# Patient Record
Sex: Male | Born: 1960 | Marital: Married | Smoking: Never smoker
Health system: Southern US, Community
[De-identification: ages and names within clinical notes are randomized; demographics above are authoritative.]

## PROBLEM LIST (undated history)

## (undated) DIAGNOSIS — I10 Essential (primary) hypertension: Secondary | ICD-10-CM

## (undated) HISTORY — PX: COLONOSCOPY W/ BIOPSIES: SHX1374

---

## 2011-12-02 ENCOUNTER — Ambulatory Visit (INDEPENDENT_AMBULATORY_CARE_PROVIDER_SITE_OTHER): Payer: BC Managed Care – PPO

## 2011-12-02 DIAGNOSIS — Z130 Encounter for screening for diseases of the blood and blood-forming organs and certain disorders involving the immune mechanism: Secondary | ICD-10-CM

## 2011-12-02 DIAGNOSIS — Z23 Encounter for immunization: Secondary | ICD-10-CM

## 2021-09-15 ENCOUNTER — Emergency Department (HOSPITAL_BASED_OUTPATIENT_CLINIC_OR_DEPARTMENT_OTHER)
Admission: EM | Admit: 2021-09-15 | Discharge: 2021-09-15 | Disposition: A | Discharge status: Home / Self Care | Payer: Managed Care, Other (non HMO) | Attending: Emergency Medicine | Admitting: Emergency Medicine

## 2021-09-15 ENCOUNTER — Other Ambulatory Visit: Payer: Self-pay

## 2021-09-15 ENCOUNTER — Encounter (HOSPITAL_BASED_OUTPATIENT_CLINIC_OR_DEPARTMENT_OTHER): Payer: Self-pay | Admitting: *Deleted

## 2021-09-15 DIAGNOSIS — I10 Essential (primary) hypertension: Secondary | ICD-10-CM | POA: Diagnosis not present

## 2021-09-15 DIAGNOSIS — D61818 Other pancytopenia: Secondary | ICD-10-CM | POA: Diagnosis present

## 2021-09-15 DIAGNOSIS — Z8616 Personal history of COVID-19: Secondary | ICD-10-CM | POA: Insufficient documentation

## 2021-09-15 HISTORY — DX: Essential (primary) hypertension: I10

## 2021-09-15 LAB — COMPREHENSIVE METABOLIC PANEL
ALT: 13 U/L (ref 0–44)
AST: 12 U/L — ABNORMAL LOW (ref 15–41)
Albumin: 4.3 g/dL (ref 3.5–5.0)
Alkaline Phosphatase: 56 U/L (ref 38–126)
Anion gap: 10 (ref 5–15)
BUN: 13 mg/dL (ref 8–23)
CO2: 25 mmol/L (ref 22–32)
Calcium: 9.4 mg/dL (ref 8.9–10.3)
Chloride: 105 mmol/L (ref 98–111)
Creatinine, Ser: 0.7 mg/dL (ref 0.61–1.24)
GFR, Estimated: >60 mL/min (ref >60)
Glucose, Bld: 104 mg/dL — ABNORMAL HIGH (ref 70–99)
Potassium: 3.7 mmol/L (ref 3.5–5.1)
Sodium: 140 mmol/L (ref 135–145)
Total Bilirubin: 0.5 mg/dL (ref 0.3–1.2)
Total Protein: 6.9 g/dL (ref 6.5–8.1)

## 2021-09-15 LAB — PROTIME-INR
INR: 1 (ref 0.8–1.2)
Prothrombin Time: 13.6 seconds (ref 11.4–15.2)

## 2021-09-15 LAB — CBC
HCT: 25.3 % — ABNORMAL LOW (ref 39.0–52.0)
Hemoglobin: 8.5 g/dL — ABNORMAL LOW (ref 13.0–17.0)
MCH: 36.2 pg — ABNORMAL HIGH (ref 26.0–34.0)
MCHC: 33.6 g/dL (ref 30.0–36.0)
MCV: 107.7 fL — ABNORMAL HIGH (ref 80.0–100.0)
Platelets: 35 10*3/uL — ABNORMAL LOW (ref 150–400)
RBC: 2.35 MIL/uL — ABNORMAL LOW (ref 4.22–5.81)
RDW: 15.1 % (ref 11.5–15.5)
WBC: 2.2 10*3/uL — ABNORMAL LOW (ref 4.0–10.5)
nRBC: 0 % (ref 0.0–0.2)

## 2021-09-15 LAB — RETICULOCYTES
Immature Retic Fract: 20.3 % — ABNORMAL HIGH (ref 2.3–15.9)
RBC.: 2.33 MIL/uL — ABNORMAL LOW (ref 4.22–5.81)
Retic Count, Absolute: 33.8 10*3/uL (ref 19.0–186.0)
Retic Ct Pct: 1.5 % (ref 0.4–3.1)

## 2021-09-15 NOTE — ED Provider Notes (Signed)
[facility] EMERGENCY DEPT Provider Note   CSN: [id number] Arrival date & time: 09/15/21  1743     History Chief Complaint  Patient presents with   Abnormal Labs    Bryce Reyes is a 60 y.o. male.  HPI Patient presents for an incidental finding of pancytopenia.  He has no known medical conditions other than hypertension.  For his hypertension, he takes losartan.  Early in the summer, he went to donate blood.  He was told at that time that he could not because his hemoglobin was 8.9.  He was also told that he had low blood pressure at that time.  He stopped taking his losartan for a while but then resumed taking it.  He had no known history of anemia prior to that.  He states that he gets yearly physicals.  His last physical would have been around April.  He was not informed of any abnormal results at that time.  He had a mild COVID infection over the summer.  He states that he has been in his normal state of health.  On Monday, he rode 14 miles on his bicycle.  When asked how he has felt, he states "I have never felt better".  Earlier today, he went and got lab work done.  Lab work showed anemia, thrombocytopenia, and leukopenia.  He was contacted via telephone and told to come to the ED immediately.  Patient denies any recent bleeding, dark stools, fatigue, night sweats.  He has noticed a recent postprandial burning in his epigastrium but denies any other recent symptoms at all.    Past Medical History:  Diagnosis Date   Hypertension     There are no problems to display for this patient.   History reviewed. No pertinent surgical history.     No family history on file.  Social History   Tobacco Use   Smoking status: Never   Smokeless tobacco: Never  Vaping Use   Vaping Use: Never used  Substance Use Topics   Alcohol use: Yes   Drug use: Never    Home Medications Prior to Admission medications   Not on File    Allergies    Patient has no known  allergies.  Review of Systems   Review of Systems  Constitutional:  Negative for activity change, chills, diaphoresis, fatigue and fever.  HENT:  Negative for congestion, ear pain and sore throat.   Eyes:  Negative for pain and visual disturbance.  Respiratory:  Negative for cough, chest tightness, shortness of breath and wheezing.   Cardiovascular:  Negative for chest pain and palpitations.  Gastrointestinal:  Negative for abdominal pain, diarrhea, nausea and vomiting.  Genitourinary:  Negative for dysuria, flank pain and hematuria.  Musculoskeletal:  Negative for arthralgias, back pain, gait problem, joint swelling, myalgias and neck pain.  Skin:  Negative for color change, pallor and rash.  Neurological:  Negative for dizziness, seizures, syncope, weakness, light-headedness, numbness and headaches.  All other systems reviewed and are negative.  Physical Exam Updated Vital Signs BP 125/78 (BP Location: Right Arm)   Pulse 74   Temp 98.2 F (36.8 C) (Oral)   Resp 20   Ht 5' 8"  (1.727 m)   Wt 74.4 kg   SpO2 100%   BMI 24.94 kg/m   Physical Exam Vitals and nursing note reviewed.  Constitutional:      General: He is not in acute distress.    Appearance: Normal appearance. He is well-developed. He is not ill-appearing or  toxic-appearing.  HENT:     Head: Normocephalic and atraumatic.     Right Ear: External ear normal.     Left Ear: External ear normal.     Nose: Nose normal.     Mouth/Throat:     Mouth: Mucous membranes are moist.     Pharynx: Oropharynx is clear.  Eyes:     General: No scleral icterus.    Extraocular Movements: Extraocular movements intact.     Conjunctiva/sclera: Conjunctivae normal.  Cardiovascular:     Rate and Rhythm: Normal rate and regular rhythm.     Heart sounds: No murmur heard. Pulmonary:     Effort: Pulmonary effort is normal. No respiratory distress.     Breath sounds: Normal breath sounds. No wheezing or rales.  Chest:     Chest wall:  No tenderness.  Abdominal:     Palpations: Abdomen is soft.     Tenderness: There is no abdominal tenderness. There is no right CVA tenderness, left CVA tenderness or guarding.  Musculoskeletal:        General: No swelling or tenderness. Normal range of motion.     Cervical back: Normal range of motion and neck supple. No rigidity or tenderness.     Right lower leg: No edema.     Left lower leg: No edema.  Lymphadenopathy:     Cervical: No cervical adenopathy.  Skin:    General: Skin is warm and dry.     Capillary Refill: Capillary refill takes less than 2 seconds.     Coloration: Skin is not jaundiced or pale.  Neurological:     General: No focal deficit present.     Mental Status: He is alert and oriented to person, place, and time.     Cranial Nerves: No cranial nerve deficit.     Sensory: No sensory deficit.     Motor: No weakness.     Coordination: Coordination normal.  Psychiatric:        Mood and Affect: Mood normal.        Behavior: Behavior normal.    ED Results / Procedures / Treatments   Labs (all labs ordered are listed, but only abnormal results are displayed) Labs Reviewed  CBC - Abnormal; Notable for the following components:      Result Value   WBC 2.2 (*)    RBC 2.35 (*)    Hemoglobin 8.5 (*)    HCT 25.3 (*)    MCV 107.7 (*)    MCH 36.2 (*)    Platelets 35 (*)    All other components within normal limits  COMPREHENSIVE METABOLIC PANEL - Abnormal; Notable for the following components:   Glucose, Bld 104 (*)    AST 12 (*)    All other components within normal limits  LACTATE DEHYDROGENASE - Abnormal; Notable for the following components:   LDH 207 (*)    All other components within normal limits  RETICULOCYTES - Abnormal; Notable for the following components:   RBC. 2.33 (*)    Immature Retic Fract 20.3 (*)    All other components within normal limits  CBC WITH DIFFERENTIAL/PLATELET - Abnormal; Notable for the following components:   WBC 2.1 (*)     RBC 2.36 (*)    Hemoglobin 8.5 (*)    HCT 25.3 (*)    MCV 107.2 (*)    MCH 36.0 (*)    Platelets 43 (*)    Neutro Abs 0.6 (*)    All other components within  normal limits  URIC ACID  PROTIME-INR  VITAMIN B12    EKG None  Radiology No results found.  Procedures Procedures   Medications Ordered in ED Medications - No data to display  ED Course  I have reviewed the triage vital signs and the nursing notes.  Pertinent labs & imaging results that were available during my care of the patient were reviewed by me and considered in my medical decision making (see chart for details).    MDM Rules/Calculators/A&P                          Patient presents for incidental finding of pancytopenia on lab work earlier today.  He denies any recent symptoms.  Vital signs upon arrival in the ED are normal.  On exam, patient is well-appearing.  There is no evidence of color change, adenopathy, pallor, areas of swelling, or tenderness.  Prior to being bedded in the ED, basic labs were obtained.  CBC showed leukopenia, macrocytic anemia, and thrombocytopenia.  I spoke with the hematologist on-call.  He advised, given absence of recent symptoms of bleeding, patient would be appropriate for discharge home with close follow-up next week.  He will likely require bone marrow biopsy for diagnosis of underlying etiology.  Additional lab work was obtained in the ED.  I spoke with the patient, who was clear that he wanted to go home, about return precautions and importance of follow-up.  Patient was informed that his white blood cell count is low and this puts him at risk for infection.  He was also informed that his platelet count was low and this puts him at risk for bleeding.  He was advised to return to the ED if he does experience any new symptoms.  If he continues to feel well, patient should follow-up with oncologist soon as possible next week.  Contact information was provided.  Patient was discharged in  stable condition.  Final Clinical Impression(s) / ED Diagnoses Final diagnoses:  Pancytopenia [facility])    Rx / [place] Orders ED Discharge Orders     None        Godfrey Pick, MD 09/17/21 1427

## 2021-09-15 NOTE — Discharge Instructions (Addendum)
Call the number on this paperwork to schedule a follow-up of appointment with Dr. Benay Spice.  Please return to the emergency department immediately if you develop any bleeding.

## 2021-09-15 NOTE — ED Triage Notes (Signed)
Pt states he had labs obtained at [facility] about 3:15, Hgb 8.4 WBC 1.0. They called him around 5pm and was insistent he come to the ED.

## 2021-09-16 LAB — CBC WITH DIFFERENTIAL/PLATELET
Abs Immature Granulocytes: 0.01 10*3/uL (ref 0.00–0.07)
Basophils Absolute: 0 10*3/uL (ref 0.0–0.1)
Basophils Relative: 0 %
Eosinophils Absolute: 0.1 10*3/uL (ref 0.0–0.5)
Eosinophils Relative: 3 %
HCT: 25.3 % — ABNORMAL LOW (ref 39.0–52.0)
Hemoglobin: 8.5 g/dL — ABNORMAL LOW (ref 13.0–17.0)
Immature Granulocytes: 1 %
Lymphocytes Relative: 62 %
Lymphs Abs: 1.3 10*3/uL (ref 0.7–4.0)
MCH: 36 pg — ABNORMAL HIGH (ref 26.0–34.0)
MCHC: 33.6 g/dL (ref 30.0–36.0)
MCV: 107.2 fL — ABNORMAL HIGH (ref 80.0–100.0)
Monocytes Absolute: 0.1 10*3/uL (ref 0.1–1.0)
Monocytes Relative: 5 %
Neutro Abs: 0.6 10*3/uL — ABNORMAL LOW (ref 1.7–7.7)
Neutrophils Relative %: 29 %
Platelets: 43 10*3/uL — ABNORMAL LOW (ref 150–400)
RBC: 2.36 MIL/uL — ABNORMAL LOW (ref 4.22–5.81)
RDW: 15.3 % (ref 11.5–15.5)
WBC: 2.1 10*3/uL — ABNORMAL LOW (ref 4.0–10.5)
nRBC: 0 % (ref 0.0–0.2)

## 2021-09-16 LAB — LACTATE DEHYDROGENASE: LDH: 207 U/L — ABNORMAL HIGH (ref 98–192)

## 2021-09-16 LAB — URIC ACID: Uric Acid, Serum: 4.1 mg/dL (ref 3.7–8.6)

## 2021-09-16 LAB — VITAMIN B12: Vitamin B-12: 365 pg/mL (ref 180–914)

## 2021-09-18 ENCOUNTER — Telehealth: Payer: Self-pay | Admitting: Hematology and Oncology

## 2021-09-18 NOTE — Telephone Encounter (Signed)
Scheduled appt per 10/10 referral. Pt is aware of appt date and time.

## 2021-09-20 ENCOUNTER — Inpatient Hospital Stay: Payer: Managed Care, Other (non HMO)

## 2021-09-20 ENCOUNTER — Inpatient Hospital Stay: Payer: Managed Care, Other (non HMO) | Attending: Hematology and Oncology | Admitting: Hematology and Oncology

## 2021-09-20 ENCOUNTER — Other Ambulatory Visit: Payer: Self-pay

## 2021-09-20 VITALS — BP 137/86 | HR 95 | Temp 98.3°F | Resp 18 | Wt 166.3 lb

## 2021-09-20 DIAGNOSIS — D61818 Other pancytopenia: Secondary | ICD-10-CM

## 2021-09-20 DIAGNOSIS — Z807 Family history of other malignant neoplasms of lymphoid, hematopoietic and related tissues: Secondary | ICD-10-CM | POA: Diagnosis not present

## 2021-09-20 DIAGNOSIS — Z85048 Personal history of other malignant neoplasm of rectum, rectosigmoid junction, and anus: Secondary | ICD-10-CM | POA: Diagnosis not present

## 2021-09-20 DIAGNOSIS — I10 Essential (primary) hypertension: Secondary | ICD-10-CM | POA: Diagnosis not present

## 2021-09-20 DIAGNOSIS — E538 Deficiency of other specified B group vitamins: Secondary | ICD-10-CM

## 2021-09-20 DIAGNOSIS — Z8616 Personal history of COVID-19: Secondary | ICD-10-CM | POA: Diagnosis not present

## 2021-09-20 LAB — CMP (CANCER CENTER ONLY)
ALT: 16 U/L (ref 0–44)
AST: 14 U/L — ABNORMAL LOW (ref 15–41)
Albumin: 4.2 g/dL (ref 3.5–5.0)
Alkaline Phosphatase: 61 U/L (ref 38–126)
Anion gap: 7 (ref 5–15)
BUN: 15 mg/dL (ref 8–23)
CO2: 28 mmol/L (ref 22–32)
Calcium: 9.8 mg/dL (ref 8.9–10.3)
Chloride: 111 mmol/L (ref 98–111)
Creatinine: 0.7 mg/dL (ref 0.61–1.24)
GFR, Estimated: >60 mL/min (ref >60)
Glucose, Bld: 105 mg/dL — ABNORMAL HIGH (ref 70–99)
Potassium: 4 mmol/L (ref 3.5–5.1)
Sodium: 146 mmol/L — ABNORMAL HIGH (ref 135–145)
Total Bilirubin: 0.4 mg/dL (ref 0.3–1.2)
Total Protein: 7.2 g/dL (ref 6.5–8.1)

## 2021-09-20 LAB — CBC WITH DIFFERENTIAL (CANCER CENTER ONLY)
Abs Immature Granulocytes: 0.01 10*3/uL (ref 0.00–0.07)
Basophils Absolute: 0 10*3/uL (ref 0.0–0.1)
Basophils Relative: 0 %
Eosinophils Absolute: 0 10*3/uL (ref 0.0–0.5)
Eosinophils Relative: 2 %
HCT: 24.8 % — ABNORMAL LOW (ref 39.0–52.0)
Hemoglobin: 8.4 g/dL — ABNORMAL LOW (ref 13.0–17.0)
Immature Granulocytes: 1 %
Lymphocytes Relative: 54 %
Lymphs Abs: 1.1 10*3/uL (ref 0.7–4.0)
MCH: 36.5 pg — ABNORMAL HIGH (ref 26.0–34.0)
MCHC: 33.9 g/dL (ref 30.0–36.0)
MCV: 107.8 fL — ABNORMAL HIGH (ref 80.0–100.0)
Monocytes Absolute: 0.1 10*3/uL (ref 0.1–1.0)
Monocytes Relative: 5 %
Neutro Abs: 0.8 10*3/uL — ABNORMAL LOW (ref 1.7–7.7)
Neutrophils Relative %: 38 %
Platelet Count: 34 10*3/uL — ABNORMAL LOW (ref 150–400)
RBC: 2.3 MIL/uL — ABNORMAL LOW (ref 4.22–5.81)
RDW: 15.1 % (ref 11.5–15.5)
WBC Count: 2 10*3/uL — ABNORMAL LOW (ref 4.0–10.5)
nRBC: 0 % (ref 0.0–0.2)

## 2021-09-20 LAB — FOLATE: Folate: 19.9 ng/mL (ref >5.9)

## 2021-09-20 LAB — VITAMIN B12: Vitamin B-12: 192 pg/mL (ref 180–914)

## 2021-09-20 LAB — RETIC PANEL
Immature Retic Fract: 19.7 % — ABNORMAL HIGH (ref 2.3–15.9)
RBC.: 2.23 MIL/uL — ABNORMAL LOW (ref 4.22–5.81)
Retic Count, Absolute: 25.2 10*3/uL (ref 19.0–186.0)
Retic Ct Pct: 1.1 % (ref 0.4–3.1)
Reticulocyte Hemoglobin: 37.3 pg (ref >27.9)

## 2021-09-20 LAB — LACTATE DEHYDROGENASE: LDH: 178 U/L (ref 98–192)

## 2021-09-20 NOTE — Progress Notes (Signed)
Bryce Reyes [phone]) [phone]   Fax:([phone]) [name] NOTE  Patient Care Team: Kathyrn Lass, MD as PCP - General (Family Medicine)  Hematological/Oncological History # Pancytopenia 09/15/2021: WBC 1.8, Hgb 8.4, MCV 105.5, Plt 33 (at [facility]) 09/15/2021: Sent to Emergency Department same day. WBC 2.1, Hgb 8.5, MCV 107.2, Plt 43 09/20/2021: establish care with Dr. Lorenso Courier   CHIEF COMPLAINTS/PURPOSE OF CONSULTATION:  "Pancytopenia "  HISTORY OF PRESENTING ILLNESS:  Bryce Reyes 60 y.o. male with medical history significant for hypertension who presents for evaluation of pancytopenia.   On review of the previous records Bryce Reyes was seen at [facility] on 09/15/2021 for an abscess on his chest. He was prescribed antibiotics bactrim after doxycycline failed to improve his symptoms. His CBC showed WBC 1.8, Hgb 8.4, MCV 105.5, Plt 33. Due to this he was sent to the Emergency Department where his counts were repeated and confirmed. He was noted to have Vitamin b12 365, LDH 207, and unremarkable CMP. Due to concern for these findings he was referred to hematology for further evaluation and management.   On exam today Bryce Reyes reports that his problems began in August when he went to donate blood and he was "rejected because his hemoglobin was too low".  He believes his hemoglobin that time was 8.9.  He notes that he typically donates blood a few times per year and this is surprising to him.  He notes that he eats mostly vegetarian but is not strict and occasionally does eat meat.  He notes that he is currently taking a multivitamin with iron and is currently scheduled for an appointment with GI on Wednesday.  He notes that he otherwise feels great.  He notes that he rides his bike about once per week up to 14 miles.  He has noticed recently getting more tired walking up stairs and he always feels tired when he goes to bed at night.  He notes that he is having  some bruising from his blood draws on his arms.  He denies having any issues with nosebleeds, gum bleeding, or dark stools.  On further discussion he notes that he is a never smoker and drinks about 1 to 2 glasses of beer or wine per week.  He notes his family history is remarkable for rectal cancer in his mother and non-Hodgkin's lymphoma and congestive heart failure in his father.  He notes that he did have COVID early this summer.  He also recently developed a cyst/abscess on his chest for which he was treated with antibiotics.  He otherwise denies any fevers, chills, sweats, nausea, vomiting or diarrhea.  A full 10 point ROS is listed below.  MEDICAL HISTORY:  Past Medical History:  Diagnosis Date   Hypertension     SURGICAL HISTORY: No past surgical history on file.  SOCIAL HISTORY: Social History   Socioeconomic History   Marital status: Married    Spouse name: Not on file   Number of children: Not on file   Years of education: Not on file   Highest education level: Not on file  Occupational History   Not on file  Tobacco Use   Smoking status: Never   Smokeless tobacco: Never  Vaping Use   Vaping Use: Never used  Substance and Sexual Activity   Alcohol use: Yes   Drug use: Never   Sexual activity: Not on file  Other Topics Concern   Not on file  Social History Narrative   Not on  file   Social Determinants of Health   Financial Resource Strain: Not on file  Food Insecurity: Not on file  Transportation Needs: Not on file  Physical Activity: Not on file  Stress: Not on file  Social Connections: Not on file  Intimate Partner Violence: Not on file    FAMILY HISTORY: No family history on file.  ALLERGIES:  has No Known Allergies.  MEDICATIONS:  Current Outpatient Medications  Medication Sig Dispense Refill   losartan (COZAAR) 50 MG tablet Take 50 mg by mouth daily.     RED YEAST RICE EXTRACT PO Take 300 mg by mouth 2 (two) times daily.     TURMERIC CURCUMIN  PO Take 1 capsule by mouth daily.     No current facility-administered medications for this visit.    REVIEW OF SYSTEMS:   Constitutional: ( - ) fevers, ( - )  chills , ( - ) night sweats Eyes: ( - ) blurriness of vision, ( - ) double vision, ( - ) watery eyes Ears, nose, mouth, throat, and face: ( - ) mucositis, ( - ) sore throat Respiratory: ( - ) cough, ( - ) dyspnea, ( - ) wheezes Cardiovascular: ( - ) palpitation, ( - ) chest discomfort, ( - ) lower extremity swelling Gastrointestinal:  ( - ) nausea, ( - ) heartburn, ( - ) change in bowel habits Skin: ( - ) abnormal skin rashes Lymphatics: ( - ) new lymphadenopathy, ( - ) easy bruising Neurological: ( - ) numbness, ( - ) tingling, ( - ) new weaknesses Behavioral/Psych: ( - ) mood change, ( - ) new changes  All other systems were reviewed with the patient and are negative.  PHYSICAL EXAMINATION: ECOG PERFORMANCE STATUS: 0 - Asymptomatic  Vitals:   09/20/21 1315  BP: 137/86  Pulse: 95  Resp: 18  Temp: 98.3 F (36.8 C)  SpO2: 100%   Filed Weights   09/20/21 1315  Weight: 166 lb 4.8 oz (75.4 kg)    GENERAL: well appearing middle-aged Caucasian male in NAD  SKIN: skin color, texture, turgor are normal, no rashes or significant lesions EYES: conjunctiva are pink and non-injected, sclera clear LUNGS: clear to auscultation and percussion with normal breathing effort HEART: regular rate & rhythm and no murmurs and no lower extremity edema Musculoskeletal: no cyanosis of digits and no clubbing  PSYCH: alert & oriented x 3, fluent speech NEURO: no focal motor/sensory deficits  LABORATORY DATA:  I have reviewed the data as listed CBC Latest Ref Rng & Units 09/20/2021 09/15/2021 09/15/2021  WBC 4.0 - 10.5 K/uL 2.0(L) 2.1(L) 2.2(L)  Hemoglobin 13.0 - 17.0 g/dL 8.4(L) 8.5(L) 8.5(L)  Hematocrit 39.0 - 52.0 % 24.8(L) 25.3(L) 25.3(L)  Platelets 150 - 400 K/uL 34(L) 43(L) 35(L)    CMP Latest Ref Rng & Units 09/20/2021 09/15/2021   Glucose 70 - 99 mg/dL 105(H) 104(H)  BUN 8 - 23 mg/dL 15 13  Creatinine 0.61 - 1.24 mg/dL 0.70 0.70  Sodium 135 - 145 mmol/L 146(H) 140  Potassium 3.5 - 5.1 mmol/L 4.0 3.7  Chloride 98 - 111 mmol/L 111 105  CO2 22 - 32 mmol/L 28 25  Calcium 8.9 - 10.3 mg/dL 9.8 9.4  Total Protein 6.5 - 8.1 g/dL 7.2 6.9  Total Bilirubin 0.3 - 1.2 mg/dL 0.4 0.5  Alkaline Phos 38 - 126 U/L 61 56  AST 15 - 41 U/L 14(L) 12(L)  ALT 0 - 44 U/L 16 13    RADIOGRAPHIC STUDIES: No results  found.  ASSESSMENT & PLAN Bryce Reyes 60 y.o. male with medical history significant for hypertension who presents for evaluation of pancytopenia.   After review of the labs, review of the records, and discussion with the patient the patients findings are most consistent with pancytopenia of unclear etiology.  Possible etiologies include nutritional deficiency, infection, or bone marrow dysfunction.  Given how he is virtually asymptomatic I do believe that this developed over a longer period of time and likely represents bone marrow dysfunction.  This is supported by the fact that there is macrocytosis and pancytopenia.  It is possible the patient does have nutritional deficiency but he is not very strict vegetarian and that would be a relatively surprising finding in session otherwise healthy man who is asymptomatic.  At this time I strongly recommend we pursue a bone marrow biopsy while working up other possible etiologies with his blood work.  # Pancytopenia --deeply concerning for a true pancytopenia with neutropenia and macrocytic anemia --will order deeper nutritional evaluation to include MMA, homocysteine --will order SPEP, SFLC --will review peripheral blood smear --patient will require a bone marrow biopsy --Return to clinic with pending the results of the above studies   Orders Placed This Encounter  Procedures   CT BIOPSY    Standing Status:   Future    Standing Expiration Date:   09/21/2022    Order  Specific Question:   Lab orders requested (DO NOT place separate lab orders, these will be automatically ordered during procedure specimen collection):    Answer:   Surgical Pathology    Order Specific Question:   Reason for Exam (SYMPTOM  OR DIAGNOSIS REQUIRED)    Answer:   requesting Bone marrow biopsy due to pancytopenia    Order Specific Question:   Preferred location?    Answer:   [facility]   CT BONE MARROW BIOPSY & ASPIRATION    Standing Status:   Future    Standing Expiration Date:   09/21/2022    Order Specific Question:   Reason for Exam (SYMPTOM  OR DIAGNOSIS REQUIRED)    Answer:   requesting Bone marrow biopsy due to pancytopenia    Order Specific Question:   Preferred location?    Answer:   [facility]   CBC with Differential ([place] Only)    Standing Status:   Future    Number of Occurrences:   1    Standing Expiration Date:   09/20/2022   CMP ([place] only)    Standing Status:   Future    Number of Occurrences:   1    Standing Expiration Date:   09/20/2022   Retic Panel    Standing Status:   Future    Number of Occurrences:   1    Standing Expiration Date:   09/20/2022   Methylmalonic acid, serum    Standing Status:   Future    Number of Occurrences:   1    Standing Expiration Date:   09/20/2022   Lactate dehydrogenase (LDH)    Standing Status:   Future    Number of Occurrences:   1    Standing Expiration Date:   09/20/2022   Vitamin B12    Standing Status:   Future    Number of Occurrences:   1    Standing Expiration Date:   09/20/2022   Folate, Serum    Standing Status:   Future    Number of Occurrences:   1  Standing Expiration Date:   09/20/2022   Homocysteine, serum    Standing Status:   Future    Number of Occurrences:   1    Standing Expiration Date:   09/20/2022   Multiple Myeloma Panel (SPEP&IFE w/QIG)    Standing Status:   Future    Number of Occurrences:   1    Standing Expiration Date:   09/20/2022    Kappa/lambda light chains    Standing Status:   Future    Number of Occurrences:   1    Standing Expiration Date:   09/20/2022   Copper, serum    Standing Status:   Future    Number of Occurrences:   1    Standing Expiration Date:   09/20/2022    All questions were answered. The patient knows to call the clinic with any problems, questions or concerns.  A total of more than 60 minutes were spent on this encounter with face-to-face time and non-face-to-face time, including preparing to see the patient, ordering tests and/or medications, counseling the patient and coordination of care as outlined above.   Ledell Peoples, MD Department of Hematology/Oncology [place] [facility] Phone: [phone] Pager: [phone] Email: [email or web address]  09/24/2021 7:00 PM

## 2021-09-20 NOTE — Patient Instructions (Signed)
Thank you for choosing [facility] to provide your care.   Should you have questions after your visit to the [facility] ([facility]), please contact this office at [phone] between 8:30 AM and 4:30 PM.  Voice mails left after 4:00 PM may not be returned until the following business day.  Calls received after 4:30 PM will be answered by an off-site Nurse Triage Line.    Prescription Refills:  Please have your pharmacy contact us directly for most prescription requests.  Contact the office directly for refills of narcotics (pain medications). Allow 48-72 hours for refills.  Appointments: Please contact the [facility] scheduling department [phone] for questions regarding [facility] appointment scheduling.  Contact the schedulers with any scheduling changes so that your appointment can be rescheduled in a timely manner.   Central Scheduling for [place] [phone] - Call to schedule procedures such as PET scans, CT scans, MRI, Ultrasound, etc.  To afford each patient quality time with our providers, please arrive 30 minutes before your scheduled appointment time.  If you arrive late for your appointment, you may be asked to reschedule.  We strive to give you quality time with our providers, and arriving late affects you and other patients whose appointments are after yours. If you are a no show for multiple scheduled visits, you may be dismissed from the clinic at the providers discretion.     Resources: [facility] Social Workers 336-832-0950 for additional information on assistance programs or assistance connecting with community support programs   [place] DSS  336-641-3447: Information regarding food stamps, Medicaid, and utility assistance [place] Access [place] 336-333-6589   [place] [employer]'s shared-ride transportation service for eligible riders who have a disability that prevents them from riding the fixed route bus.   Medicare Rights Center 800-333-4114  Helps people with Medicare understand their rights and benefits, navigate the Medicare system, and secure the quality healthcare they deserve [employer] 800-227-2345 Assists patients locate various types of support and financial assistance Cancer Care: 1-800-813-HOPE (4673) Provides financial assistance, online support groups, medication/co-pay assistance.   Transportation Assistance for appointments at [facility]: Transportation Coordinator [phone]  Again, thank you for choosing  Cancer Center for your care.       

## 2021-09-21 ENCOUNTER — Telehealth: Payer: Self-pay | Admitting: *Deleted

## 2021-09-21 LAB — KAPPA/LAMBDA LIGHT CHAINS
Kappa free light chain: 20 mg/L — ABNORMAL HIGH (ref 3.3–19.4)
Kappa, lambda light chain ratio: 1.56 (ref 0.26–1.65)
Lambda free light chains: 12.8 mg/L (ref 5.7–26.3)

## 2021-09-21 LAB — HOMOCYSTEINE: Homocysteine: 8.6 umol/L (ref 0.0–17.2)

## 2021-09-21 NOTE — Telephone Encounter (Signed)
Received vm message from pt asking if there is anything he can take to boost his CBC numbers while he is waiting for his CT guided bone marrow biopsy. Returned his call and spoke with him. Advised that there is nothing to take as far as treatment for low counts. The key is getting his bone marrow done, hopefully sooner than 10/23/21. Advised that I have contacted Radiology scheduling to have them move up his biopsy.  Awaiting a call back on that. Made pt ware of this.  Advised continued neutropenia precautions and eating well balanced, iron rich foods.  Advised I will call if we can get his biopsy moved up.  He voiced understanding.

## 2021-09-22 LAB — COPPER, SERUM: Copper: 163 ug/dL — ABNORMAL HIGH (ref 69–132)

## 2021-09-22 LAB — METHYLMALONIC ACID, SERUM: Methylmalonic Acid, Quantitative: 113 nmol/L (ref 0–378)

## 2021-09-25 LAB — MULTIPLE MYELOMA PANEL, SERUM
Albumin SerPl Elph-Mcnc: 3.8 g/dL (ref 2.9–4.4)
Albumin/Glob SerPl: 1.4 (ref 0.7–1.7)
Alpha 1: 0.3 g/dL (ref 0.0–0.4)
Alpha2 Glob SerPl Elph-Mcnc: 0.6 g/dL (ref 0.4–1.0)
B-Globulin SerPl Elph-Mcnc: 1 g/dL (ref 0.7–1.3)
Gamma Glob SerPl Elph-Mcnc: 1 g/dL (ref 0.4–1.8)
Globulin, Total: 2.9 g/dL (ref 2.2–3.9)
IgA: 124 mg/dL (ref 61–437)
IgG (Immunoglobin G), Serum: 958 mg/dL (ref 603–1613)
IgM (Immunoglobulin M), Srm: 98 mg/dL (ref 20–172)
Total Protein ELP: 6.7 g/dL (ref 6.0–8.5)

## 2021-10-04 ENCOUNTER — Telehealth: Payer: Self-pay | Admitting: *Deleted

## 2021-10-04 NOTE — Telephone Encounter (Signed)
TCT patient after receiving a call from him about getting a Covid Vaccine. Advised that Dr. Lorenso Courier recommends he hold off on this for now-until we get a better handle on why his WBC is low.  Also advised that we are working on getting his Bone Marrow Biopsy moved up as it is currently scheduled for 10/23/21  Informed him that I am checking with [facility] as well.  He voiced understanding and is willing to go there if needed.  Advised that I will keep him up to date as I lrarn of of any new availability for this.

## 2021-10-06 ENCOUNTER — Telehealth: Payer: Self-pay | Admitting: *Deleted

## 2021-10-06 NOTE — Telephone Encounter (Signed)
TCT patient on his mobile # . Spoke with family member as he was driving. Put on speaker phone. Advised pt that we were able to re-schedule his Bone marrow biopsy @ [facility] on 10/12/21@ 7:30 am. Pt is agreeable to this  Provided address of that facility and where he needs to go. Someone from their radiology department will call him a couple of days before his procedure  He voiced understanding.

## 2021-10-09 NOTE — Progress Notes (Signed)
Patient on schedule for BMB, called and spoke with patient on phone with pre procedure instructions given. Made aware to be here @ 0730, NPO after MN prior to procedure, and driver post procedure/recovery/discharge. Stated understanding.

## 2021-10-11 ENCOUNTER — Other Ambulatory Visit: Payer: Self-pay | Admitting: Radiology

## 2021-10-12 ENCOUNTER — Other Ambulatory Visit: Payer: Self-pay

## 2021-10-12 ENCOUNTER — Ambulatory Visit
Admission: RE | Admit: 2021-10-12 | Discharge: 2021-10-12 | Disposition: A | Discharge status: Home / Self Care | Payer: Managed Care, Other (non HMO) | Source: Ambulatory Visit | Attending: Hematology and Oncology | Admitting: Hematology and Oncology

## 2021-10-12 DIAGNOSIS — D61818 Other pancytopenia: Secondary | ICD-10-CM | POA: Insufficient documentation

## 2021-10-12 DIAGNOSIS — C9200 Acute myeloblastic leukemia, not having achieved remission: Secondary | ICD-10-CM | POA: Insufficient documentation

## 2021-10-12 LAB — CBC WITH DIFFERENTIAL/PLATELET
Abs Immature Granulocytes: 0.01 10*3/uL (ref 0.00–0.07)
Basophils Absolute: 0 10*3/uL (ref 0.0–0.1)
Basophils Relative: 1 %
Eosinophils Absolute: 0.1 10*3/uL (ref 0.0–0.5)
Eosinophils Relative: 3 %
HCT: 25.5 % — ABNORMAL LOW (ref 39.0–52.0)
Hemoglobin: 8.5 g/dL — ABNORMAL LOW (ref 13.0–17.0)
Immature Granulocytes: 1 %
Lymphocytes Relative: 48 %
Lymphs Abs: 1.1 10*3/uL (ref 0.7–4.0)
MCH: 36.3 pg — ABNORMAL HIGH (ref 26.0–34.0)
MCHC: 33.3 g/dL (ref 30.0–36.0)
MCV: 109 fL — ABNORMAL HIGH (ref 80.0–100.0)
Monocytes Absolute: 0.1 10*3/uL (ref 0.1–1.0)
Monocytes Relative: 5 %
Neutro Abs: 0.9 10*3/uL — ABNORMAL LOW (ref 1.7–7.7)
Neutrophils Relative %: 42 %
Platelets: 33 10*3/uL — ABNORMAL LOW (ref 150–400)
RBC: 2.34 MIL/uL — ABNORMAL LOW (ref 4.22–5.81)
RDW: 14.8 % (ref 11.5–15.5)
Smear Review: NORMAL
WBC: 2.1 10*3/uL — ABNORMAL LOW (ref 4.0–10.5)
nRBC: 0 % (ref 0.0–0.2)

## 2021-10-12 MED ORDER — SODIUM CHLORIDE 0.9 % IV SOLN: INTRAVENOUS | Status: DC

## 2021-10-12 MED ORDER — FENTANYL CITRATE (PF) 100 MCG/2ML IJ SOLN
INTRAMUSCULAR | Status: AC
  Filled 2021-10-12: qty 2

## 2021-10-12 MED ORDER — MIDAZOLAM HCL 2 MG/2ML IJ SOLN
INTRAMUSCULAR | Status: AC
  Filled 2021-10-12: qty 2

## 2021-10-12 MED ORDER — HEPARIN SOD (PORK) LOCK FLUSH 100 UNIT/ML IV SOLN
INTRAVENOUS | Status: AC
  Filled 2021-10-12: qty 5

## 2021-10-12 MED ORDER — MIDAZOLAM HCL 2 MG/2ML IJ SOLN
INTRAMUSCULAR | Status: AC | PRN
  Administered 2021-10-12: 1 mg via INTRAVENOUS

## 2021-10-12 MED ORDER — FENTANYL CITRATE (PF) 100 MCG/2ML IJ SOLN
INTRAMUSCULAR | Status: AC | PRN
  Administered 2021-10-12: 50 ug via INTRAVENOUS

## 2021-10-12 NOTE — H&P (Signed)
Chief Complaint: Patient was seen in consultation today for bone marrow biopsy at the request of Dorsey,John T IV  Referring Physician(s): Dorsey,John T IV  Patient Status: [facility] - Out-pt  History of Present Illness: Bryce Reyes is a 60 y.o. male presenting with pancytopenia.  Hematological/Oncological History # Pancytopenia 09/15/2021: WBC 1.8, Hgb 8.4, MCV 105.5, Plt 33 (at [facility]) 09/15/2021: Sent to Emergency Department same day. WBC 2.1, Hgb 8.5, MCV 107.2, Plt 43 09/20/2021: establish care with Dr. Lorenso Courier   He has had some dyspnea with exertion and fatigue.  Past Medical History:  Diagnosis Date   Hypertension     Past Surgical History:  Procedure Laterality Date   COLONOSCOPY W/ BIOPSIES      Allergies: Patient has no known allergies.  Medications: Prior to Admission medications   Medication Sig Start Date End Date Taking? Authorizing Provider  losartan (COZAAR) 50 MG tablet Take 50 mg by mouth daily. 08/26/21  Yes [provider]  Multiple Vitamins-Iron (MULTIVITAMINS WITH IRON) TABS tablet Take 1 tablet by mouth daily.   Yes [provider]  RED YEAST RICE EXTRACT PO Take 300 mg by mouth 2 (two) times daily.   Yes [provider]  TURMERIC CURCUMIN PO Take 1 capsule by mouth daily.   Yes [provider]     History reviewed. No pertinent family history.  Social History   Socioeconomic History   Marital status: Married    Spouse name: Jana Half   Number of children: 3   Years of education: Not on file   Highest education level: Not on file  Occupational History   Not on file  Tobacco Use   Smoking status: Never   Smokeless tobacco: Never  Vaping Use   Vaping Use: Never used  Substance and Sexual Activity   Alcohol use: Yes    Alcohol/week: 4.0 standard drinks    Types: 2 Glasses of wine, 2 Cans of beer per week    Comment: 12 drinks/week   Drug use: Never   Sexual activity: Not on file  Other Topics  Concern   Not on file  Social History Narrative   Lives at home with wife: 3 kids grown    Social Determinants of Health   Financial Resource Strain: Not on file  Food Insecurity: Not on file  Transportation Needs: Not on file  Physical Activity: Not on file  Stress: Not on file  Social Connections: Not on file    ECOG Status: 1 - Symptomatic but completely ambulatory  Review of Systems: A 12 point ROS discussed and pertinent positives are indicated in the HPI above.  All other systems are negative.  Review of Systems  Constitutional:  Positive for fatigue. Negative for chills and fever.  Respiratory:         Some dyspnea with exertion.  Cardiovascular: Negative.   Gastrointestinal: Negative.   Genitourinary: Negative.   Musculoskeletal: Negative.   Neurological: Negative.   Hematological:        Small area of petechiae recently on distal right forearm.   Vital Signs: BP 127/87   Pulse 81   Temp 98 F (36.7 C) (Oral)   Resp 20   Ht _0  (1.727 m)   Wt 74.8 kg   SpO2 100%   BMI 25.09 kg/m   Physical Exam Vitals reviewed.  Constitutional:      General: He is not in acute distress.    Appearance: Normal appearance. He is not ill-appearing, toxic-appearing or diaphoretic.  HENT:     Head: Normocephalic and atraumatic.  Cardiovascular:     Rate and Rhythm: Normal rate and regular rhythm.     Heart sounds: Normal heart sounds. No murmur heard.   No friction rub. No gallop.  Pulmonary:     Effort: Pulmonary effort is normal. No respiratory distress.     Breath sounds: Normal breath sounds. No stridor. No wheezing, rhonchi or rales.  Abdominal:     General: There is no distension.     Palpations: Abdomen is soft. There is no mass.     Tenderness: There is no abdominal tenderness. There is no guarding or rebound.     Hernia: No hernia is present.  Musculoskeletal:        General: No swelling.     Cervical back: Neck supple.  Lymphadenopathy:     Cervical: No  cervical adenopathy.  Skin:    General: Skin is warm and dry.     Coloration: Skin is not jaundiced.  Neurological:     General: No focal deficit present.     Mental Status: He is alert and oriented to person, place, and time.    Imaging: No results found.  Labs:  CBC: Recent Labs    09/15/21 1838 09/15/21 2302 09/20/21 1548 10/12/21 0801  WBC 2.2* 2.1* 2.0* 2.1*  HGB 8.5* 8.5* 8.4* 8.5*  HCT 25.3* 25.3* 24.8* 25.5*  PLT 35* 43* 34* 33*    COAGS: Recent Labs    09/15/21 2302  INR 1.0    BMP: Recent Labs    09/15/21 1838 09/20/21 1548  NA 140 146*  K 3.7 4.0  CL 105 111  CO2 25 28  GLUCOSE 104* 105*  BUN 13 15  CALCIUM 9.4 9.8  CREATININE 0.70 0.70  GFRNONAA >60 >60    LIVER FUNCTION TESTS: Recent Labs    09/15/21 1838 09/20/21 1548  BILITOT 0.5 0.4  AST 12* 14*  ALT 13 16  ALKPHOS 56 61  PROT 6.9 7.2  ALBUMIN 4.3 4.2     Assessment and Plan:  For bone marrow biopsy today under CT guidance for further hematologic workup of pancytopenia. Risks and benefits of bone marrow biopsy was discussed with the patient and/or patient's family including, but not limited to bleeding, infection, damage to adjacent structures or low yield requiring additional tests. All of the questions were answered and there is agreement to proceed. Consent signed and in chart.   Thank you for this interesting consult.  I greatly enjoyed meeting Bryce Reyes and look forward to participating in their care.  A copy of this report was sent to the requesting provider on this date.  Electronically Signed: Azzie Roup, MD 10/12/2021, 8:46 AM    I spent a total of 15 Minutes  in face to face in clinical consultation, greater than 50% of which was counseling/coordinating care for bone marrow biopsy.

## 2021-10-12 NOTE — Progress Notes (Signed)
Patient clinically stable post BMB per DR Kathlene Cote, tolerated well. Denies complaints post procedure. Report given to Fransico Michael RN post procedure in specials received Versed 1 mg along with Fentanyl 50 mcg Iv for procedure.

## 2021-10-12 NOTE — Procedures (Signed)
Interventional Radiology Procedure Note  Procedure: CT guided bone marrow aspiration and biopsy  Complications: None  EBL: < 10 mL  Findings: Aspirate and core biopsy performed of bone marrow in right iliac bone.  Plan: Bedrest supine x 1 hrs  Elsworth Ledin T. Nikolina Simerson, M.D Pager:  [phone]   

## 2021-10-16 LAB — SURGICAL PATHOLOGY

## 2021-10-19 ENCOUNTER — Encounter (HOSPITAL_COMMUNITY): Payer: Self-pay | Admitting: Hematology and Oncology

## 2021-10-23 ENCOUNTER — Ambulatory Visit (HOSPITAL_COMMUNITY): Payer: Managed Care, Other (non HMO)

## 2021-10-23 ENCOUNTER — Encounter (HOSPITAL_COMMUNITY): Payer: Self-pay | Admitting: Hematology and Oncology

## 2021-12-15 ENCOUNTER — Telehealth: Payer: Self-pay | Admitting: Hematology and Oncology

## 2021-12-15 ENCOUNTER — Telehealth: Payer: Self-pay | Admitting: *Deleted

## 2021-12-15 ENCOUNTER — Other Ambulatory Visit: Payer: Self-pay | Admitting: *Deleted

## 2021-12-15 DIAGNOSIS — D61818 Other pancytopenia: Secondary | ICD-10-CM

## 2021-12-15 NOTE — Telephone Encounter (Signed)
Received call from Adonis Huguenin, [place] @ [facility]. 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 Adonis Huguenin is asking if we can check his labs and transfuse as needed 2 x a week on mondays and Thursdays, until his next treatment which will 01/23/22. This will need to start on 12/18/21  Advised that we can do this though I would need to check if we have the space available for transfusion on 12/18/21.  Adonis Huguenin states  She will keep his appts @ [facility] for 12/18/21 until we know his appt schedule  here @ [facility]

## 2021-12-15 NOTE — Telephone Encounter (Signed)
Scheduled per 01/06 scheduled message, patient has been called and notified.

## 2021-12-18 ENCOUNTER — Inpatient Hospital Stay: Payer: 59

## 2021-12-18 ENCOUNTER — Inpatient Hospital Stay: Payer: 59 | Attending: Hematology and Oncology

## 2021-12-18 ENCOUNTER — Other Ambulatory Visit: Payer: Self-pay

## 2021-12-18 DIAGNOSIS — C9201 Acute myeloblastic leukemia, in remission: Secondary | ICD-10-CM | POA: Diagnosis not present

## 2021-12-18 DIAGNOSIS — D61818 Other pancytopenia: Secondary | ICD-10-CM

## 2021-12-18 LAB — CMP (CANCER CENTER ONLY)
ALT: 32 U/L (ref 0–44)
AST: 15 U/L (ref 15–41)
Albumin: 4.1 g/dL (ref 3.5–5.0)
Alkaline Phosphatase: 69 U/L (ref 38–126)
Anion gap: 9 (ref 5–15)
BUN: 22 mg/dL (ref 8–23)
CO2: 25 mmol/L (ref 22–32)
Calcium: 9.2 mg/dL (ref 8.9–10.3)
Chloride: 104 mmol/L (ref 98–111)
Creatinine: 0.98 mg/dL (ref 0.61–1.24)
GFR, Estimated: >60 mL/min (ref >60)
Glucose, Bld: 110 mg/dL — ABNORMAL HIGH (ref 70–99)
Potassium: 3.9 mmol/L (ref 3.5–5.1)
Sodium: 138 mmol/L (ref 135–145)
Total Bilirubin: 0.9 mg/dL (ref 0.3–1.2)
Total Protein: 6.9 g/dL (ref 6.5–8.1)

## 2021-12-18 LAB — CBC WITH DIFFERENTIAL (CANCER CENTER ONLY)
Abs Immature Granulocytes: 0 10*3/uL (ref 0.00–0.07)
Band Neutrophils: 2 %
Basophils Absolute: 0 10*3/uL (ref 0.0–0.1)
Basophils Relative: 0 %
Eosinophils Absolute: 0.1 10*3/uL (ref 0.0–0.5)
Eosinophils Relative: 1 %
HCT: 29.2 % — ABNORMAL LOW (ref 39.0–52.0)
Hemoglobin: 9.8 g/dL — ABNORMAL LOW (ref 13.0–17.0)
Lymphocytes Relative: 6 %
Lymphs Abs: 0.4 10*3/uL — ABNORMAL LOW (ref 0.7–4.0)
MCH: 31.6 pg (ref 26.0–34.0)
MCHC: 33.6 g/dL (ref 30.0–36.0)
MCV: 94.2 fL (ref 80.0–100.0)
Monocytes Absolute: 0.1 10*3/uL (ref 0.1–1.0)
Monocytes Relative: 2 %
Neutro Abs: 5.9 10*3/uL (ref 1.7–7.7)
Neutrophils Relative %: 89 %
Platelet Count: 57 10*3/uL — ABNORMAL LOW (ref 150–400)
RBC: 3.1 MIL/uL — ABNORMAL LOW (ref 4.22–5.81)
RDW: 16.9 % — ABNORMAL HIGH (ref 11.5–15.5)
Smear Review: NORMAL
WBC Count: 6.5 10*3/uL (ref 4.0–10.5)
nRBC: 0 % (ref 0.0–0.2)

## 2021-12-18 LAB — MAGNESIUM: Magnesium: 1.9 mg/dL (ref 1.7–2.4)

## 2021-12-20 ENCOUNTER — Inpatient Hospital Stay: Payer: 59

## 2021-12-20 ENCOUNTER — Other Ambulatory Visit: Payer: Self-pay

## 2021-12-20 ENCOUNTER — Other Ambulatory Visit: Payer: Self-pay | Admitting: *Deleted

## 2021-12-20 DIAGNOSIS — D61818 Other pancytopenia: Secondary | ICD-10-CM

## 2021-12-20 DIAGNOSIS — C9201 Acute myeloblastic leukemia, in remission: Secondary | ICD-10-CM | POA: Diagnosis not present

## 2021-12-20 LAB — CMP (CANCER CENTER ONLY)
ALT: 26 U/L (ref 0–44)
AST: 13 U/L — ABNORMAL LOW (ref 15–41)
Albumin: 3.9 g/dL (ref 3.5–5.0)
Alkaline Phosphatase: 62 U/L (ref 38–126)
Anion gap: 7 (ref 5–15)
BUN: 24 mg/dL — ABNORMAL HIGH (ref 8–23)
CO2: 27 mmol/L (ref 22–32)
Calcium: 8.9 mg/dL (ref 8.9–10.3)
Chloride: 103 mmol/L (ref 98–111)
Creatinine: 0.85 mg/dL (ref 0.61–1.24)
GFR, Estimated: >60 mL/min (ref >60)
Glucose, Bld: 126 mg/dL — ABNORMAL HIGH (ref 70–99)
Potassium: 4.3 mmol/L (ref 3.5–5.1)
Sodium: 137 mmol/L (ref 135–145)
Total Bilirubin: 0.8 mg/dL (ref 0.3–1.2)
Total Protein: 6.6 g/dL (ref 6.5–8.1)

## 2021-12-20 LAB — MAGNESIUM: Magnesium: 1.9 mg/dL (ref 1.7–2.4)

## 2021-12-20 LAB — CBC WITH DIFFERENTIAL (CANCER CENTER ONLY)
Abs Immature Granulocytes: 0 10*3/uL (ref 0.00–0.07)
Band Neutrophils: 2 %
Basophils Absolute: 0 10*3/uL (ref 0.0–0.1)
Basophils Relative: 0 %
Eosinophils Absolute: 0 10*3/uL (ref 0.0–0.5)
Eosinophils Relative: 1 %
HCT: 24.2 % — ABNORMAL LOW (ref 39.0–52.0)
Hemoglobin: 8.4 g/dL — ABNORMAL LOW (ref 13.0–17.0)
Lymphocytes Relative: 16 %
Lymphs Abs: 0.3 10*3/uL — ABNORMAL LOW (ref 0.7–4.0)
MCH: 32.1 pg (ref 26.0–34.0)
MCHC: 34.7 g/dL (ref 30.0–36.0)
MCV: 92.4 fL (ref 80.0–100.0)
Monocytes Absolute: 0.1 10*3/uL (ref 0.1–1.0)
Monocytes Relative: 3 %
Neutro Abs: 1.7 10*3/uL (ref 1.7–7.7)
Neutrophils Relative %: 78 %
Platelet Count: 18 10*3/uL — ABNORMAL LOW (ref 150–400)
RBC: 2.62 MIL/uL — ABNORMAL LOW (ref 4.22–5.81)
RDW: 16.6 % — ABNORMAL HIGH (ref 11.5–15.5)
Smear Review: NORMAL
WBC Count: 2.1 10*3/uL — ABNORMAL LOW (ref 4.0–10.5)
nRBC: 0 % (ref 0.0–0.2)

## 2021-12-21 ENCOUNTER — Inpatient Hospital Stay: Payer: 59

## 2021-12-21 DIAGNOSIS — D61818 Other pancytopenia: Secondary | ICD-10-CM

## 2021-12-21 DIAGNOSIS — C9201 Acute myeloblastic leukemia, in remission: Secondary | ICD-10-CM | POA: Diagnosis not present

## 2021-12-21 LAB — ABO/RH
ABO/RH(D): B NEG
ABO/RH(D): B NEG

## 2021-12-21 MED ORDER — ACETAMINOPHEN 325 MG PO TABS
650.0000 mg | ORAL_TABLET | Freq: Once | ORAL | Status: AC
  Administered 2021-12-21: 650 mg via ORAL
  Filled 2021-12-21: qty 2

## 2021-12-21 MED ORDER — SODIUM CHLORIDE 0.9% IV SOLUTION
250.0000 mL | Freq: Once | INTRAVENOUS | Status: AC
  Administered 2021-12-21: 250 mL via INTRAVENOUS

## 2021-12-21 NOTE — Patient Instructions (Signed)
Platelet Transfusion ?A platelet transfusion is a procedure in which a person receives donated platelets through an IV. Platelets are parts of blood that stick together and form a clot to help the body stop bleeding after an injury. If you have too few platelets, your blood may have trouble clotting. This may cause you to bleed and bruise very easily. ?You may need a platelet transfusion if you have a condition that causes a low number of platelets (thrombocytopenia). A platelet transfusion may be used to stop or prevent excessive bleeding. ?Tell a health care provider about: ?Any reactions you have had during previous transfusions. ?Any allergies you have. ?All medicines you are taking, including vitamins, herbs, eye drops, creams, and over-the-counter medicines. ?Any bleeding problems you have. ?Any surgeries you have had. ?Any medical conditions you have. ?Whether you are pregnant or may be pregnant. ?What are the risks? ?Generally, this is a safe procedure. However, problems may occur, including: ?Fever. ?Infection. ?Allergic reaction to the donated (donor) platelets. ?Your body's disease-fighting system (immune system) attacking the donor platelets (hemolytic reaction). This is rare. ?A rare reaction that causes lung damage (transfusion-related acute lung injury). ?What happens before the procedure? ?Medicines ?Ask your health care provider about: ?Changing or stopping your regular medicines. This is especially important if you are taking diabetes medicines or blood thinners. ?Taking medicines such as aspirin and ibuprofen. These medicines can thin your blood. Do not take these medicines unless your health care provider tells you to take them. ?Taking over-the-counter medicines, vitamins, herbs, and supplements. ?General instructions ?You will have a blood test to determine your blood type. Your blood type determines what kind of platelets you will be given. ?Follow instructions from your health care provider  about eating or drinking restrictions. ?If you have had an allergic reaction to a transfusion in the past, you may be given medicine to help prevent a reaction. ?Your temperature, blood pressure, pulse, and breathing will be monitored. ?What happens during the procedure? ? ?An IV will be inserted into one of your veins. ?For your safety, two health care providers will verify your identity along with the donor platelets about to be infused. ?A bag of donor platelets will be connected to your IV. The platelets will flow into your bloodstream. This usually takes 30-60 minutes. ?Your temperature, blood pressure, pulse, and breathing will be monitored during the transfusion. This helps detect early signs of any reaction. ?You will also be monitored for other symptoms that may indicate a reaction, including chills, hives, or itching. ?If you have signs of a reaction at any time, your transfusion will be stopped, and you may be given medicine to help manage the reaction. ?When your transfusion is complete, your IV will be removed. ?Pressure may be applied to the IV site for a few minutes to stop any bleeding. ?The IV site will be covered with a bandage (dressing). ?The procedure may vary among health care providers and hospitals. ?What can I expect after the procedure? ?Your blood pressure, temperature, pulse, and breathing will be monitored until you leave the hospital or clinic. ?You may have some bruising and soreness at your IV site. ?Follow these instructions at home: ?Medicines ?Take over-the-counter and prescription medicines only as told by your health care provider. ?Talk with your health care provider before you take any medicines that contain aspirin or NSAIDs, such as ibuprofen. These medicines increase your risk for dangerous bleeding. ?IV site care ?Check your IV site every day for signs of infection. Check for: ?  Redness, swelling, or pain. ?Fluid or blood. If fluid or blood drains from your IV site, use your  hands to press down firmly on a bandage covering the area for a minute or two. Doing this should stop the bleeding. ?Warmth. ?Pus or a bad smell. ?General instructions ?Change or remove your dressing as told by your health care provider. ?Return to your normal activities as told by your health care provider. Ask your health care provider what activities are safe for you. ?Do not take baths, swim, or use a hot tub until your health care provider approves. Ask your health care provider if you may take showers. ?Keep all follow-up visits. This is important. ?Contact a health care provider if: ?You have a headache that does not go away with medicine. ?You have hives, rash, or itchy skin. ?You have nausea or vomiting. ?You feel unusually tired or weak. ?You have signs of infection at your IV site. ?Get help right away if: ?You have a fever or chills. ?You urinate less often than usual. ?Your urine is darker colored than normal. ?You have any of the following: ?Trouble breathing. ?Pain in your back, abdomen, or chest. ?Cool, clammy skin. ?A fast heartbeat. ?Summary ?Platelets are tiny pieces of blood cells that clump together to form a blood clot when you have an injury. If you have too few platelets, your blood may have trouble clotting. ?A platelet transfusion is a procedure in which you receive donated platelets through an IV. ?A platelet transfusion may be used to stop or prevent excessive bleeding. ?After the procedure, check your IV site every day for signs of infection. ?This information is not intended to replace advice given to you by your health care provider. Make sure you discuss any questions you have with your health care provider. ?Document Revised: 06/01/2021 Document Reviewed: 06/01/2021 ?Elsevier Patient Education ? 2022 Elsevier Inc. ? ?

## 2021-12-22 LAB — BPAM PLATELET PHERESIS
Blood Product Expiration Date: 202301142359
ISSUE DATE / TIME: 202301120915
Unit Type and Rh: 7300

## 2021-12-22 LAB — PREPARE PLATELET PHERESIS: Unit division: 0

## 2021-12-25 ENCOUNTER — Other Ambulatory Visit: Payer: Self-pay

## 2021-12-25 ENCOUNTER — Inpatient Hospital Stay: Payer: 59

## 2021-12-25 ENCOUNTER — Inpatient Hospital Stay: Payer: 59 | Admitting: Hematology and Oncology

## 2021-12-25 ENCOUNTER — Other Ambulatory Visit: Payer: Self-pay | Admitting: Hematology and Oncology

## 2021-12-25 ENCOUNTER — Other Ambulatory Visit: Payer: Self-pay | Admitting: *Deleted

## 2021-12-25 VITALS — BP 128/84 | HR 93 | Temp 96.2°F | Resp 18 | Wt 158.4 lb

## 2021-12-25 DIAGNOSIS — D61818 Other pancytopenia: Secondary | ICD-10-CM

## 2021-12-25 DIAGNOSIS — C9201 Acute myeloblastic leukemia, in remission: Secondary | ICD-10-CM

## 2021-12-25 DIAGNOSIS — Z95828 Presence of other vascular implants and grafts: Secondary | ICD-10-CM | POA: Insufficient documentation

## 2021-12-25 LAB — CMP (CANCER CENTER ONLY)
ALT: 42 U/L (ref 0–44)
AST: 27 U/L (ref 15–41)
Albumin: 3.5 g/dL (ref 3.5–5.0)
Alkaline Phosphatase: 65 U/L (ref 38–126)
Anion gap: 9 (ref 5–15)
BUN: 13 mg/dL (ref 8–23)
CO2: 22 mmol/L (ref 22–32)
Calcium: 8.3 mg/dL — ABNORMAL LOW (ref 8.9–10.3)
Chloride: 105 mmol/L (ref 98–111)
Creatinine: 0.84 mg/dL (ref 0.61–1.24)
GFR, Estimated: >60 mL/min (ref >60)
Glucose, Bld: 104 mg/dL — ABNORMAL HIGH (ref 70–99)
Potassium: 3.7 mmol/L (ref 3.5–5.1)
Sodium: 136 mmol/L (ref 135–145)
Total Bilirubin: 0.4 mg/dL (ref 0.3–1.2)
Total Protein: 6.4 g/dL — ABNORMAL LOW (ref 6.5–8.1)

## 2021-12-25 LAB — CBC WITH DIFFERENTIAL (CANCER CENTER ONLY)
Abs Immature Granulocytes: 0 10*3/uL (ref 0.00–0.07)
Band Neutrophils: 13 %
Basophils Absolute: 0 10*3/uL (ref 0.0–0.1)
Basophils Relative: 1 %
Eosinophils Absolute: 0 10*3/uL (ref 0.0–0.5)
Eosinophils Relative: 0 %
HCT: 22.1 % — ABNORMAL LOW (ref 39.0–52.0)
Hemoglobin: 7.7 g/dL — ABNORMAL LOW (ref 13.0–17.0)
Lymphocytes Relative: 18 %
Lymphs Abs: 0.8 10*3/uL (ref 0.7–4.0)
MCH: 31.7 pg (ref 26.0–34.0)
MCHC: 34.8 g/dL (ref 30.0–36.0)
MCV: 90.9 fL (ref 80.0–100.0)
Metamyelocytes Relative: 1 %
Monocytes Absolute: 0.7 10*3/uL (ref 0.1–1.0)
Monocytes Relative: 16 %
Neutro Abs: 2.9 10*3/uL (ref 1.7–7.7)
Neutrophils Relative %: 51 %
Platelet Count: 12 10*3/uL — ABNORMAL LOW (ref 150–400)
RBC: 2.43 MIL/uL — ABNORMAL LOW (ref 4.22–5.81)
RDW: 15.5 % (ref 11.5–15.5)
Smear Review: NORMAL
WBC Count: 4.6 10*3/uL (ref 4.0–10.5)
nRBC: 0 % (ref 0.0–0.2)

## 2021-12-25 LAB — MAGNESIUM: Magnesium: 1.9 mg/dL (ref 1.7–2.4)

## 2021-12-25 LAB — SAMPLE TO BLOOD BANK

## 2021-12-25 LAB — PREPARE RBC (CROSSMATCH)

## 2021-12-25 MED ORDER — SODIUM CHLORIDE 0.9% FLUSH
10.0000 mL | Freq: Once | INTRAVENOUS | Status: AC
  Administered 2021-12-25: 10 mL

## 2021-12-25 MED ORDER — HEPARIN SOD (PORK) LOCK FLUSH 100 UNIT/ML IV SOLN
500.0000 [IU] | Freq: Every day | INTRAVENOUS | Status: AC | PRN
  Administered 2021-12-25: 500 [IU]

## 2021-12-25 MED ORDER — ACETAMINOPHEN 325 MG PO TABS
650.0000 mg | ORAL_TABLET | Freq: Once | ORAL | Status: AC
  Administered 2021-12-25: 650 mg via ORAL
  Filled 2021-12-25: qty 2

## 2021-12-25 MED ORDER — SODIUM CHLORIDE 0.9% IV SOLUTION
250.0000 mL | Freq: Once | INTRAVENOUS | Status: AC
  Administered 2021-12-25: 250 mL via INTRAVENOUS

## 2021-12-25 MED ORDER — SODIUM CHLORIDE 0.9% FLUSH
10.0000 mL | INTRAVENOUS | Status: AC | PRN
  Administered 2021-12-25: 10 mL

## 2021-12-25 NOTE — Patient Instructions (Signed)
Platelet Transfusion A platelet transfusion is a procedure in which a person receives donated platelets through an IV. Platelets are parts of blood that stick together and form a clot to help the body stop bleeding after an injury. If you have too few platelets, your blood may have trouble clotting. This may cause you to bleed and bruise very easily. You may need a platelet transfusion if you have a condition that causes a low number of platelets (thrombocytopenia). A platelet transfusion may be used to stop or prevent excessive bleeding. Tell a health care provider about: Any reactions you have had during previous transfusions. Any allergies you have. All medicines you are taking, including vitamins, herbs, eye drops, creams, and over-the-counter medicines. Any bleeding problems you have. Any surgeries you have had. Any medical conditions you have. Whether you are pregnant or may be pregnant. What are the risks? Generally, this is a safe procedure. However, problems may occur, including: Fever. Infection. Allergic reaction to the donated (donor) platelets. Your body's disease-fighting system (immune system) attacking the donor platelets (hemolytic reaction). This is rare. A rare reaction that causes lung damage (transfusion-related acute lung injury). What happens before the procedure? Medicines Ask your health care provider about: Changing or stopping your regular medicines. This is especially important if you are taking diabetes medicines or blood thinners. Taking medicines such as aspirin and ibuprofen. These medicines can thin your blood. Do not take these medicines unless your health care provider tells you to take them. Taking over-the-counter medicines, vitamins, herbs, and supplements. General instructions You will have a blood test to determine your blood type. Your blood type determines what kind of platelets you will be given. Follow instructions from your health care provider  about eating or drinking restrictions. If you have had an allergic reaction to a transfusion in the past, you may be given medicine to help prevent a reaction. Your temperature, blood pressure, pulse, and breathing will be monitored. What happens during the procedure?  An IV will be inserted into one of your veins. For your safety, two health care providers will verify your identity along with the donor platelets about to be infused. A bag of donor platelets will be connected to your IV. The platelets will flow into your bloodstream. This usually takes 30-60 minutes. Your temperature, blood pressure, pulse, and breathing will be monitored during the transfusion. This helps detect early signs of any reaction. You will also be monitored for other symptoms that may indicate a reaction, including chills, hives, or itching. If you have signs of a reaction at any time, your transfusion will be stopped, and you may be given medicine to help manage the reaction. When your transfusion is complete, your IV will be removed. Pressure may be applied to the IV site for a few minutes to stop any bleeding. The IV site will be covered with a bandage (dressing). The procedure may vary among health care providers and hospitals. What can I expect after the procedure? Your blood pressure, temperature, pulse, and breathing will be monitored until you leave the hospital or clinic. You may have some bruising and soreness at your IV site. Follow these instructions at home: Medicines Take over-the-counter and prescription medicines only as told by your health care provider. Talk with your health care provider before you take any medicines that contain aspirin or NSAIDs, such as ibuprofen. These medicines increase your risk for dangerous bleeding. IV site care Check your IV site every day for signs of infection. Check for:  Redness, swelling, or pain. Fluid or blood. If fluid or blood drains from your IV site, use your  hands to press down firmly on a bandage covering the area for a minute or two. Doing this should stop the bleeding. Warmth. Pus or a bad smell. General instructions Change or remove your dressing as told by your health care provider. Return to your normal activities as told by your health care provider. Ask your health care provider what activities are safe for you. Do not take baths, swim, or use a hot tub until your health care provider approves. Ask your health care provider if you may take showers. Keep all follow-up visits. This is important. Contact a health care provider if: You have a headache that does not go away with medicine. You have hives, rash, or itchy skin. You have nausea or vomiting. You feel unusually tired or weak. You have signs of infection at your IV site. Get help right away if: You have a fever or chills. You urinate less often than usual. Your urine is darker colored than normal. You have any of the following: Trouble breathing. Pain in your back, abdomen, or chest. Cool, clammy skin. A fast heartbeat. Summary Platelets are tiny pieces of blood cells that clump together to form a blood clot when you have an injury. If you have too few platelets, your blood may have trouble clotting. A platelet transfusion is a procedure in which you receive donated platelets through an IV. A platelet transfusion may be used to stop or prevent excessive bleeding. After the procedure, check your IV site every day for signs of infection. This information is not intended to replace advice given to you by your health care provider. Make sure you discuss any questions you have with your health care provider. Document Revised: 06/01/2021 Document Reviewed: 06/01/2021 Elsevier Patient Education  [place].  Blood Transfusion, Adult, Care After This sheet gives you information about how to care for yourself after your procedure. Your doctor may also give you more specific  instructions. If you have problems or questions, contact your doctor. What can I expect after the procedure? After the procedure, it is common to have: Bruising and soreness at the IV site. A headache. Follow these instructions at home: Insertion site care   Follow instructions from your doctor about how to take care of your insertion site. This is where an IV tube was put into your vein. Make sure you: Wash your hands with soap and water before and after you change your bandage (dressing). If you cannot use soap and water, use hand sanitizer. Change your bandage as told by your doctor. Check your insertion site every day for signs of infection. Check for: Redness, swelling, or pain. Bleeding from the site. Warmth. Pus or a bad smell. General instructions Take over-the-counter and prescription medicines only as told by your doctor. Rest as told by your doctor. Go back to your normal activities as told by your doctor. Keep all follow-up visits as told by your doctor. This is important. Contact a doctor if: You have itching or red, swollen areas of skin (hives). You feel worried or nervous (anxious). You feel weak after doing your normal activities. You have redness, swelling, warmth, or pain around the insertion site. You have blood coming from the insertion site, and the blood does not stop with pressure. You have pus or a bad smell coming from the insertion site. Get help right away if: You have signs of a  serious reaction. This may be coming from an allergy or the body's defense system (immune system). Signs include: Trouble breathing or shortness of breath. Swelling of the face or feeling warm (flushed). Fever or chills. Head, chest, or back pain. Dark pee (urine) or blood in the pee. Widespread rash. Fast heartbeat. Feeling dizzy or light-headed. You may receive your blood transfusion in an outpatient setting. If so, you will be told whom to contact to report any  reactions. These symptoms may be an emergency. Do not wait to see if the symptoms will go away. Get medical help right away. Call your local emergency services (911 in the [place] Do not drive yourself to the hospital. Summary Bruising and soreness at the IV site are common. Check your insertion site every day for signs of infection. Rest as told by your doctor. Go back to your normal activities as told by your doctor. Get help right away if you have signs of a serious reaction. This information is not intended to replace advice given to you by your health care provider. Make sure you discuss any questions you have with your health care provider. Document Revised: 03/23/2021 Document Reviewed: 05/21/2019 Elsevier Patient Education  [place].

## 2021-12-25 NOTE — Progress Notes (Signed)
[place] [phone]) [phone]   [phone]) [phone]  PROGRESS NOTE  Patient Care Team: Kathyrn Lass, MD as PCP - General (Family Medicine)  Hematological/Oncological History # Acute Myeloid Leukemia, In remission 09/15/2021: WBC 1.8, Hgb 8.4, MCV 105.5, Plt 33 (at [facility]) 09/15/2021: Sent to Emergency Department same day. WBC 2.1, Hgb 8.5, MCV 107.2, Plt 43 09/20/2021: establish care with Dr. 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 10/25/2021: started chemotherapy at [facility]. Clinical trial with 7+3 and selinexor.  12/01/2021: remission noted on bone marrow biopsy at [facility]   Interval History:  Bryce Reyes 61 y.o. male with medical history significant for AML in remission who presents for a follow up visit. The patient's last visit was on 09/20/2021 at which time he established care. In the interim since the last visit he established care at [facility] and underwent intensive inpatient chemotherapy for his AML.  On 12/01/2021 the patient was noted to be in remission and plans were started for HIDAC consolidation with Selinexor and plans to proceed with bone marrow transplant.  On exam today Bryce Reyes notes that he has been well in the interim since his last discharge from [facility].  He notes that he is not as physically active as he had been previously due to fatigue but he is doing his best to at least walk around the block once per day.  He denies any bleeding or bruising.  He reports that his wife and family have been very supportive of him at home.  He endorses having constipation for the last 4 to 5 days and has been trying MiraLAX once daily with no relief in his constipation.  He is also been taking senna docusate without much results.  He denies any nausea or vomiting and reports that his appetite is quite good.  He has some occasional chills but denies any fevers or sweats.  He  is also not having any bleeding, bruising, or dark stools.  A full 10 point ROS is listed below.  MEDICAL HISTORY:  Past Medical History:  Diagnosis Date   Hypertension     SURGICAL HISTORY: Past Surgical History:  Procedure Laterality Date   COLONOSCOPY W/ BIOPSIES      SOCIAL HISTORY: Social History   Socioeconomic History   Marital status: Married    Spouse name: Jana Half   Number of children: 3   Years of education: Not on file   Highest education level: Not on file  Occupational History   Not on file  Tobacco Use   Smoking status: Never   Smokeless tobacco: Never  Vaping Use   Vaping Use: Never used  Substance and Sexual Activity   Alcohol use: Yes    Alcohol/week: 4.0 standard drinks    Types: 2 Glasses of wine, 2 Cans of beer per week    Comment: 12 drinks/week   Drug use: Never   Sexual activity: Not on file  Other Topics Concern   Not on file  Social History Narrative   Lives at home with wife: 3 kids grown    Social Determinants of Health   Financial Resource Strain: Not on file  Food Insecurity: Not on file  Transportation Needs: Not on file  Physical Activity: Not on file  Stress: Not on file  Social Connections: Not on file  Intimate Partner Violence: Not on file    FAMILY HISTORY: No family history on file.  ALLERGIES:  is  allergic to cefepime.  MEDICATIONS:  Current Outpatient Medications  Medication Sig Dispense Refill   fluconazole (DIFLUCAN) 200 MG tablet Take by mouth.     levofloxacin (LEVAQUIN) 500 MG tablet Take by mouth.     OLANZapine (ZYPREXA) 5 MG tablet Take by mouth.     selinexor 60 MG TBPK Take 60 mg (3 tablets) on Day 8, 10, 15 and 17 of each cycle. Study Drug-provided at no charge. Caution:  Drug limited by Federal ([place]) Law to investigational use only.     acyclovir (ZOVIRAX) 400 MG tablet Take 400 mg by mouth 2 (two) times daily.     amLODipine (NORVASC) 5 MG tablet Take 5 mg by mouth daily.     ondansetron  (ZOFRAN) 8 MG tablet Take by mouth.     ondansetron (ZOFRAN-ODT) 8 MG disintegrating tablet Take 8 mg by mouth 3 (three) times daily.     Pediatric Multivit-Minerals-C (VITALETS CHILDRENS) CHEW Chew by mouth.     polyethylene glycol powder (GLYCOLAX/MIRALAX) 17 GM/SCOOP powder Take by mouth.     No current facility-administered medications for this visit.   Facility-Administered Medications Ordered in Other Visits  Medication Dose Route Frequency Provider Last Rate Last Admin   heparin lock flush 100 unit/mL  500 Units Intracatheter Daily PRN Ledell Peoples IV, MD       sodium chloride flush (NS) 0.9 % injection 10 mL  10 mL Intracatheter PRN Orson Slick, MD        REVIEW OF SYSTEMS:   Constitutional: ( - ) fevers, ( - )  chills , ( - ) night sweats Eyes: ( - ) blurriness of vision, ( - ) double vision, ( - ) watery eyes Ears, nose, mouth, throat, and face: ( - ) mucositis, ( - ) sore throat Respiratory: ( - ) cough, ( - ) dyspnea, ( - ) wheezes Cardiovascular: ( - ) palpitation, ( - ) chest discomfort, ( - ) lower extremity swelling Gastrointestinal:  ( - ) nausea, ( - ) heartburn, ( - ) change in bowel habits Skin: ( - ) abnormal skin rashes Lymphatics: ( - ) new lymphadenopathy, ( - ) easy bruising Neurological: ( - ) numbness, ( - ) tingling, ( - ) new weaknesses Behavioral/Psych: ( - ) mood change, ( - ) new changes  All other systems were reviewed with the patient and are negative.  PHYSICAL EXAMINATION: ECOG PERFORMANCE STATUS: 1 - Symptomatic but completely ambulatory  Vitals:   12/25/21 0821  BP: 128/84  Pulse: 93  Resp: 18  Temp: (!) 96.2 F (35.7 C)  SpO2: 100%   Filed Weights   12/25/21 0821  Weight: 158 lb 7 oz (71.9 kg)    GENERAL: Well-appearing middle-age Caucasian male, alert, no distress and comfortable SKIN: skin color, texture, turgor are normal, no rashes or significant lesions EYES: conjunctiva are pink and non-injected, sclera clear LUNGS:  clear to auscultation and percussion with normal breathing effort HEART: regular rate & rhythm and no murmurs and no lower extremity edema Musculoskeletal: no cyanosis of digits and no clubbing  PSYCH: alert & oriented x 3, fluent speech NEURO: no focal motor/sensory deficits  LABORATORY DATA:  I have reviewed the data as listed CBC Latest Ref Rng & Units 12/25/2021 12/20/2021 12/18/2021  WBC 4.0 - 10.5 K/uL 4.6 2.1(L) 6.5  Hemoglobin 13.0 - 17.0 g/dL 7.7(L) 8.4(L) 9.8(L)  Hematocrit 39.0 - 52.0 % 22.1(L) 24.2(L) 29.2(L)  Platelets 150 - 400 K/uL 12(L) 18(L)  57(L)    CMP Latest Ref Rng & Units 12/25/2021 12/20/2021 12/18/2021  Glucose 70 - 99 mg/dL 104(H) 126(H) 110(H)  BUN 8 - 23 mg/dL 13 24(H) 22  Creatinine 0.61 - 1.24 mg/dL 0.84 0.85 0.98  Sodium 135 - 145 mmol/L 136 137 138  Potassium 3.5 - 5.1 mmol/L 3.7 4.3 3.9  Chloride 98 - 111 mmol/L 105 103 104  CO2 22 - 32 mmol/L _0 Calcium 8.9 - 10.3 mg/dL 8.3(L) 8.9 9.2  Total Protein 6.5 - 8.1 g/dL 6.4(L) 6.6 6.9  Total Bilirubin 0.3 - 1.2 mg/dL 0.4 0.8 0.9  Alkaline Phos 38 - 126 U/L 65 62 69  AST 15 - 41 U/L 27 13(L) 15  ALT 0 - 44 U/L 42 26 32    Lab Results  Component Value Date   MPROTEIN Not Observed 09/20/2021   Lab Results  Component Value Date   KPAFRELGTCHN 20.0 (H) 09/20/2021   LAMBDASER 12.8 09/20/2021   KAPLAMBRATIO 1.56 09/20/2021    RADIOGRAPHIC STUDIES: I have personally reviewed the radiological images as listed and agreed with the findings in the report. No results found.  ASSESSMENT & PLAN Kramer Hanrahan 61 y.o. male with medical history significant for AML in remission who presents for a follow up visit.  Bryce Reyes was diagnosed in our clinic with AML and subsequently transferred to [facility] for further evaluation management.  On 10/25/2021 patient started inpatient chemotherapy with cytarabine, daunorubicin, and selinexor.  On 12/02/2019 the patient underwent bone  marrow biopsy which showed no increase in blasts and was MRD negative.  As such she was declared in complete remission.  Plans were started for HIDAC consolidation and consideration of bone marrow transplant.   Today we discussed the concept of comanaged care.  Comanaged care is when the patient has a local primary provider who administers local support and therapy while expert advice and treatment recommendations are rendered by a cancer specialist at a large academic center.  In this arrangement we provide local support, labs, treatment, and emergency visits, however the major decisions regarding the course of treatment are decided by a physician at an academic medical center.  The patient voiced his understanding of comanaged care and was agreeable to proceeding forward with this care model.  # Acute Myeloid Leukemia, In remission --patient is s/p 7+3 and selinexor (clinic trial) at [facility] --currently planning for consolidation HIDAC with consideration of bone marrow transplant.  --currently under co-managed care with [facility] Plan: --twice weekly lab visits for transfusion support --recommend 1 unit Plt for Plt <20 and 2 units PRBC for Hgb <8.0 --RTC q 4 weeks for continued monitoring .   No orders of the defined types were placed in this encounter.   All questions were answered. The patient knows to call the clinic with any problems, questions or concerns.  A total of more than 30 minutes were spent on this encounter with face-to-face time and non-face-to-face time, including preparing to see the patient, ordering tests and/or medications, counseling the patient and coordination of care as outlined above.   Ledell Peoples, MD Department of Hematology/Oncology [place] at [facility] Phone: [phone] Pager: [phone] Email: [email or web address]  12/25/2021 9:57 AM

## 2021-12-26 LAB — BPAM PLATELET PHERESIS
Blood Product Expiration Date: 202301192359
ISSUE DATE / TIME: 202301160951
Unit Type and Rh: 5100

## 2021-12-26 LAB — TYPE AND SCREEN
ABO/RH(D): B NEG
Antibody Screen: NEGATIVE
Unit division: 0
Unit division: 0

## 2021-12-26 LAB — BPAM RBC
Blood Product Expiration Date: 202301202359
Blood Product Expiration Date: 202301232359
ISSUE DATE / TIME: 202301161055
ISSUE DATE / TIME: 202301161055
Unit Type and Rh: 1700
Unit Type and Rh: 1700

## 2021-12-26 LAB — PREPARE PLATELET PHERESIS: Unit division: 0

## 2021-12-27 ENCOUNTER — Telehealth: Payer: Self-pay

## 2021-12-27 NOTE — Telephone Encounter (Signed)
Pt spoke with Access Nurse requesting call from RN. Returned call and patient stating that the Senna worked for constipation. He had tried CALM with no results. No further questions.

## 2021-12-28 ENCOUNTER — Inpatient Hospital Stay: Payer: 59

## 2021-12-28 ENCOUNTER — Other Ambulatory Visit: Payer: Self-pay

## 2021-12-28 DIAGNOSIS — C9201 Acute myeloblastic leukemia, in remission: Secondary | ICD-10-CM | POA: Diagnosis not present

## 2021-12-28 DIAGNOSIS — D61818 Other pancytopenia: Secondary | ICD-10-CM

## 2021-12-28 DIAGNOSIS — Z95828 Presence of other vascular implants and grafts: Secondary | ICD-10-CM

## 2021-12-28 LAB — CMP (CANCER CENTER ONLY)
ALT: 36 U/L (ref 0–44)
AST: 20 U/L (ref 15–41)
Albumin: 3.9 g/dL (ref 3.5–5.0)
Alkaline Phosphatase: 70 U/L (ref 38–126)
Anion gap: 9 (ref 5–15)
BUN: 12 mg/dL (ref 8–23)
CO2: 24 mmol/L (ref 22–32)
Calcium: 8.7 mg/dL — ABNORMAL LOW (ref 8.9–10.3)
Chloride: 104 mmol/L (ref 98–111)
Creatinine: 0.86 mg/dL (ref 0.61–1.24)
GFR, Estimated: >60 mL/min (ref >60)
Glucose, Bld: 156 mg/dL — ABNORMAL HIGH (ref 70–99)
Potassium: 3.8 mmol/L (ref 3.5–5.1)
Sodium: 137 mmol/L (ref 135–145)
Total Bilirubin: 0.3 mg/dL (ref 0.3–1.2)
Total Protein: 6.4 g/dL — ABNORMAL LOW (ref 6.5–8.1)

## 2021-12-28 LAB — CBC WITH DIFFERENTIAL (CANCER CENTER ONLY)
Abs Immature Granulocytes: 0.11 10*3/uL — ABNORMAL HIGH (ref 0.00–0.07)
Basophils Absolute: 0 10*3/uL (ref 0.0–0.1)
Basophils Relative: 0 %
Eosinophils Absolute: 0 10*3/uL (ref 0.0–0.5)
Eosinophils Relative: 0 %
HCT: 28.4 % — ABNORMAL LOW (ref 39.0–52.0)
Hemoglobin: 9.6 g/dL — ABNORMAL LOW (ref 13.0–17.0)
Immature Granulocytes: 2 %
Lymphocytes Relative: 7 %
Lymphs Abs: 0.4 10*3/uL — ABNORMAL LOW (ref 0.7–4.0)
MCH: 30.5 pg (ref 26.0–34.0)
MCHC: 33.8 g/dL (ref 30.0–36.0)
MCV: 90.2 fL (ref 80.0–100.0)
Monocytes Absolute: 0.9 10*3/uL (ref 0.1–1.0)
Monocytes Relative: 15 %
Neutro Abs: 4.2 10*3/uL (ref 1.7–7.7)
Neutrophils Relative %: 76 %
Platelet Count: 37 10*3/uL — ABNORMAL LOW (ref 150–400)
RBC: 3.15 MIL/uL — ABNORMAL LOW (ref 4.22–5.81)
RDW: 15.9 % — ABNORMAL HIGH (ref 11.5–15.5)
WBC Count: 5.5 10*3/uL (ref 4.0–10.5)
nRBC: 0.5 % — ABNORMAL HIGH (ref 0.0–0.2)

## 2021-12-28 LAB — SAMPLE TO BLOOD BANK

## 2021-12-28 LAB — MAGNESIUM: Magnesium: 1.9 mg/dL (ref 1.7–2.4)

## 2021-12-28 MED ORDER — HEPARIN SOD (PORK) LOCK FLUSH 100 UNIT/ML IV SOLN
500.0000 [IU] | Freq: Once | INTRAVENOUS | Status: AC
  Administered 2021-12-28: 500 [IU] via INTRAVENOUS

## 2021-12-28 MED ORDER — SODIUM CHLORIDE 0.9% FLUSH
10.0000 mL | Freq: Once | INTRAVENOUS | Status: AC
  Administered 2021-12-28: 10 mL via INTRAVENOUS

## 2021-12-28 MED ORDER — SODIUM CHLORIDE 0.9% FLUSH
10.0000 mL | Freq: Once | INTRAVENOUS | Status: AC
  Administered 2021-12-28: 10 mL

## 2021-12-28 NOTE — Progress Notes (Signed)
Per Dr. Lorenso Courier, Pt does not need blood/platelets today. Pt discharged in stable condition.

## 2022-01-01 ENCOUNTER — Other Ambulatory Visit: Payer: Self-pay

## 2022-01-01 ENCOUNTER — Inpatient Hospital Stay: Payer: 59

## 2022-01-01 VITALS — BP 139/93 | HR 67 | Temp 98.4°F | Resp 17 | Wt 158.8 lb

## 2022-01-01 DIAGNOSIS — C9201 Acute myeloblastic leukemia, in remission: Secondary | ICD-10-CM

## 2022-01-01 DIAGNOSIS — D61818 Other pancytopenia: Secondary | ICD-10-CM

## 2022-01-01 DIAGNOSIS — Z95828 Presence of other vascular implants and grafts: Secondary | ICD-10-CM

## 2022-01-01 LAB — TYPE AND SCREEN
ABO/RH(D): B NEG
Antibody Screen: NEGATIVE

## 2022-01-01 LAB — CMP (CANCER CENTER ONLY)
ALT: 33 U/L (ref 0–44)
AST: 16 U/L (ref 15–41)
Albumin: 3.8 g/dL (ref 3.5–5.0)
Alkaline Phosphatase: 67 U/L (ref 38–126)
Anion gap: 8 (ref 5–15)
BUN: 16 mg/dL (ref 8–23)
CO2: 22 mmol/L (ref 22–32)
Calcium: 8.6 mg/dL — ABNORMAL LOW (ref 8.9–10.3)
Chloride: 108 mmol/L (ref 98–111)
Creatinine: 0.79 mg/dL (ref 0.61–1.24)
GFR, Estimated: >60 mL/min (ref >60)
Glucose, Bld: 128 mg/dL — ABNORMAL HIGH (ref 70–99)
Potassium: 4.1 mmol/L (ref 3.5–5.1)
Sodium: 138 mmol/L (ref 135–145)
Total Bilirubin: 0.3 mg/dL (ref 0.3–1.2)
Total Protein: 6.3 g/dL — ABNORMAL LOW (ref 6.5–8.1)

## 2022-01-01 LAB — CBC WITH DIFFERENTIAL (CANCER CENTER ONLY)
Abs Immature Granulocytes: 0.06 10*3/uL (ref 0.00–0.07)
Basophils Absolute: 0 10*3/uL (ref 0.0–0.1)
Basophils Relative: 0 %
Eosinophils Absolute: 0 10*3/uL (ref 0.0–0.5)
Eosinophils Relative: 0 %
HCT: 27.8 % — ABNORMAL LOW (ref 39.0–52.0)
Hemoglobin: 9.8 g/dL — ABNORMAL LOW (ref 13.0–17.0)
Immature Granulocytes: 1 %
Lymphocytes Relative: 8 %
Lymphs Abs: 0.4 10*3/uL — ABNORMAL LOW (ref 0.7–4.0)
MCH: 31.4 pg (ref 26.0–34.0)
MCHC: 35.3 g/dL (ref 30.0–36.0)
MCV: 89.1 fL (ref 80.0–100.0)
Monocytes Absolute: 0.5 10*3/uL (ref 0.1–1.0)
Monocytes Relative: 9 %
Neutro Abs: 4.2 10*3/uL (ref 1.7–7.7)
Neutrophils Relative %: 82 %
Platelet Count: 46 10*3/uL — ABNORMAL LOW (ref 150–400)
RBC: 3.12 MIL/uL — ABNORMAL LOW (ref 4.22–5.81)
RDW: 15.4 % (ref 11.5–15.5)
WBC Count: 5.1 10*3/uL (ref 4.0–10.5)
nRBC: 0 % (ref 0.0–0.2)

## 2022-01-01 LAB — MAGNESIUM: Magnesium: 1.8 mg/dL (ref 1.7–2.4)

## 2022-01-01 MED ORDER — HEPARIN SOD (PORK) LOCK FLUSH 100 UNIT/ML IV SOLN
500.0000 [IU] | Freq: Once | INTRAVENOUS | Status: AC
  Administered 2022-01-01: 500 [IU] via INTRAVENOUS

## 2022-01-01 MED ORDER — SODIUM CHLORIDE 0.9% FLUSH
10.0000 mL | Freq: Once | INTRAVENOUS | Status: AC
  Administered 2022-01-01: 10 mL via INTRAVENOUS

## 2022-01-01 MED ORDER — SODIUM CHLORIDE 0.9% FLUSH
10.0000 mL | Freq: Once | INTRAVENOUS | Status: AC
  Administered 2022-01-01: 10 mL

## 2022-01-01 NOTE — Progress Notes (Signed)
Per Lorenso Courier MD, no transfusion needed today. HGB 9.8, pt asymptomatic. VSS. Pt deaccessed and discharged with no complaints.

## 2022-01-04 ENCOUNTER — Inpatient Hospital Stay: Payer: 59

## 2022-01-04 ENCOUNTER — Other Ambulatory Visit: Payer: Self-pay

## 2022-01-04 VITALS — BP 138/97 | HR 73 | Temp 98.6°F | Resp 17

## 2022-01-04 DIAGNOSIS — C9201 Acute myeloblastic leukemia, in remission: Secondary | ICD-10-CM | POA: Diagnosis not present

## 2022-01-04 DIAGNOSIS — Z95828 Presence of other vascular implants and grafts: Secondary | ICD-10-CM

## 2022-01-04 DIAGNOSIS — D61818 Other pancytopenia: Secondary | ICD-10-CM

## 2022-01-04 LAB — CBC WITH DIFFERENTIAL (CANCER CENTER ONLY)
Abs Immature Granulocytes: 0.02 10*3/uL (ref 0.00–0.07)
Basophils Absolute: 0 10*3/uL (ref 0.0–0.1)
Basophils Relative: 0 %
Eosinophils Absolute: 0 10*3/uL (ref 0.0–0.5)
Eosinophils Relative: 0 %
HCT: 29.1 % — ABNORMAL LOW (ref 39.0–52.0)
Hemoglobin: 9.9 g/dL — ABNORMAL LOW (ref 13.0–17.0)
Immature Granulocytes: 0 %
Lymphocytes Relative: 7 %
Lymphs Abs: 0.4 10*3/uL — ABNORMAL LOW (ref 0.7–4.0)
MCH: 31.1 pg (ref 26.0–34.0)
MCHC: 34 g/dL (ref 30.0–36.0)
MCV: 91.5 fL (ref 80.0–100.0)
Monocytes Absolute: 0.7 10*3/uL (ref 0.1–1.0)
Monocytes Relative: 12 %
Neutro Abs: 4.6 10*3/uL (ref 1.7–7.7)
Neutrophils Relative %: 81 %
Platelet Count: 67 10*3/uL — ABNORMAL LOW (ref 150–400)
RBC: 3.18 MIL/uL — ABNORMAL LOW (ref 4.22–5.81)
RDW: 16.6 % — ABNORMAL HIGH (ref 11.5–15.5)
WBC Count: 5.7 10*3/uL (ref 4.0–10.5)
nRBC: 0 % (ref 0.0–0.2)

## 2022-01-04 LAB — TYPE AND SCREEN
ABO/RH(D): B NEG
Antibody Screen: NEGATIVE

## 2022-01-04 LAB — CMP (CANCER CENTER ONLY)
ALT: 43 U/L (ref 0–44)
AST: 25 U/L (ref 15–41)
Albumin: 4 g/dL (ref 3.5–5.0)
Alkaline Phosphatase: 67 U/L (ref 38–126)
Anion gap: 7 (ref 5–15)
BUN: 15 mg/dL (ref 8–23)
CO2: 25 mmol/L (ref 22–32)
Calcium: 8.8 mg/dL — ABNORMAL LOW (ref 8.9–10.3)
Chloride: 108 mmol/L (ref 98–111)
Creatinine: 0.68 mg/dL (ref 0.61–1.24)
GFR, Estimated: >60 mL/min (ref >60)
Glucose, Bld: 109 mg/dL — ABNORMAL HIGH (ref 70–99)
Potassium: 4.1 mmol/L (ref 3.5–5.1)
Sodium: 140 mmol/L (ref 135–145)
Total Bilirubin: 0.4 mg/dL (ref 0.3–1.2)
Total Protein: 6.4 g/dL — ABNORMAL LOW (ref 6.5–8.1)

## 2022-01-04 LAB — MAGNESIUM: Magnesium: 1.9 mg/dL (ref 1.7–2.4)

## 2022-01-04 MED ORDER — HEPARIN SOD (PORK) LOCK FLUSH 100 UNIT/ML IV SOLN
500.0000 [IU] | Freq: Once | INTRAVENOUS | Status: AC
  Administered 2022-01-04: 500 [IU] via INTRAVENOUS

## 2022-01-04 MED ORDER — SODIUM CHLORIDE 0.9% FLUSH
10.0000 mL | Freq: Once | INTRAVENOUS | Status: AC
  Administered 2022-01-04: 10 mL via INTRAVENOUS

## 2022-01-04 MED ORDER — SODIUM CHLORIDE 0.9% FLUSH
10.0000 mL | Freq: Once | INTRAVENOUS | Status: AC
  Administered 2022-01-04: 10 mL

## 2022-01-04 NOTE — Progress Notes (Signed)
Per MD RN, to transfusion needed today, labs are within parameters. Pt agreeable. Port deaccessed and pt discharged in stable condition.

## 2022-01-04 NOTE — Patient Instructions (Signed)

## 2022-01-08 ENCOUNTER — Inpatient Hospital Stay: Payer: 59

## 2022-01-08 ENCOUNTER — Other Ambulatory Visit: Payer: Self-pay

## 2022-01-08 DIAGNOSIS — Z95828 Presence of other vascular implants and grafts: Secondary | ICD-10-CM

## 2022-01-08 DIAGNOSIS — D61818 Other pancytopenia: Secondary | ICD-10-CM

## 2022-01-08 DIAGNOSIS — C9201 Acute myeloblastic leukemia, in remission: Secondary | ICD-10-CM | POA: Diagnosis not present

## 2022-01-08 LAB — CBC WITH DIFFERENTIAL (CANCER CENTER ONLY)
Abs Immature Granulocytes: 0.04 10*3/uL (ref 0.00–0.07)
Basophils Absolute: 0 10*3/uL (ref 0.0–0.1)
Basophils Relative: 0 %
Eosinophils Absolute: 0 10*3/uL (ref 0.0–0.5)
Eosinophils Relative: 0 %
HCT: 28.6 % — ABNORMAL LOW (ref 39.0–52.0)
Hemoglobin: 9.8 g/dL — ABNORMAL LOW (ref 13.0–17.0)
Immature Granulocytes: 1 %
Lymphocytes Relative: 9 %
Lymphs Abs: 0.5 10*3/uL — ABNORMAL LOW (ref 0.7–4.0)
MCH: 31.9 pg (ref 26.0–34.0)
MCHC: 34.3 g/dL (ref 30.0–36.0)
MCV: 93.2 fL (ref 80.0–100.0)
Monocytes Absolute: 0.8 10*3/uL (ref 0.1–1.0)
Monocytes Relative: 14 %
Neutro Abs: 4.4 10*3/uL (ref 1.7–7.7)
Neutrophils Relative %: 76 %
Platelet Count: 72 10*3/uL — ABNORMAL LOW (ref 150–400)
RBC: 3.07 MIL/uL — ABNORMAL LOW (ref 4.22–5.81)
RDW: 18.3 % — ABNORMAL HIGH (ref 11.5–15.5)
WBC Count: 5.7 10*3/uL (ref 4.0–10.5)
nRBC: 0 % (ref 0.0–0.2)

## 2022-01-08 LAB — CMP (CANCER CENTER ONLY)
ALT: 36 U/L (ref 0–44)
AST: 19 U/L (ref 15–41)
Albumin: 4 g/dL (ref 3.5–5.0)
Alkaline Phosphatase: 60 U/L (ref 38–126)
Anion gap: 7 (ref 5–15)
BUN: 14 mg/dL (ref 8–23)
CO2: 26 mmol/L (ref 22–32)
Calcium: 8.9 mg/dL (ref 8.9–10.3)
Chloride: 109 mmol/L (ref 98–111)
Creatinine: 0.66 mg/dL (ref 0.61–1.24)
GFR, Estimated: >60 mL/min (ref >60)
Glucose, Bld: 134 mg/dL — ABNORMAL HIGH (ref 70–99)
Potassium: 3.8 mmol/L (ref 3.5–5.1)
Sodium: 142 mmol/L (ref 135–145)
Total Bilirubin: 0.4 mg/dL (ref 0.3–1.2)
Total Protein: 6.4 g/dL — ABNORMAL LOW (ref 6.5–8.1)

## 2022-01-08 LAB — SAMPLE TO BLOOD BANK

## 2022-01-08 LAB — MAGNESIUM: Magnesium: 1.8 mg/dL (ref 1.7–2.4)

## 2022-01-08 MED ORDER — HEPARIN SOD (PORK) LOCK FLUSH 100 UNIT/ML IV SOLN
500.0000 [IU] | Freq: Once | INTRAVENOUS | Status: AC
  Administered 2022-01-08: 500 [IU] via INTRAVENOUS

## 2022-01-08 MED ORDER — SODIUM CHLORIDE 0.9% FLUSH
10.0000 mL | Freq: Once | INTRAVENOUS | Status: AC
  Administered 2022-01-08: 10 mL

## 2022-01-08 MED ORDER — SODIUM CHLORIDE 0.9% FLUSH
10.0000 mL | Freq: Once | INTRAVENOUS | Status: AC
  Administered 2022-01-08: 10 mL via INTRAVENOUS

## 2022-01-08 NOTE — Progress Notes (Signed)
Per Dr. Lorenso Courier no transfusion needed today. Pt's Hgb 9.8 and Plts 72.  Pt states "I feel fine" and is agreeable to no transfusion.  Pt's PAC deaccessed. Pt ambulatory to lobby.

## 2022-01-11 ENCOUNTER — Inpatient Hospital Stay: Payer: 59 | Attending: Hematology and Oncology

## 2022-01-11 ENCOUNTER — Inpatient Hospital Stay: Payer: 59

## 2022-01-11 ENCOUNTER — Telehealth: Payer: Self-pay

## 2022-01-11 ENCOUNTER — Other Ambulatory Visit: Payer: Self-pay

## 2022-01-11 DIAGNOSIS — D61818 Other pancytopenia: Secondary | ICD-10-CM

## 2022-01-11 DIAGNOSIS — C9201 Acute myeloblastic leukemia, in remission: Secondary | ICD-10-CM

## 2022-01-11 DIAGNOSIS — Z95828 Presence of other vascular implants and grafts: Secondary | ICD-10-CM

## 2022-01-11 LAB — CBC WITH DIFFERENTIAL (CANCER CENTER ONLY)
Abs Immature Granulocytes: 0.03 10*3/uL (ref 0.00–0.07)
Basophils Absolute: 0 10*3/uL (ref 0.0–0.1)
Basophils Relative: 0 %
Eosinophils Absolute: 0 10*3/uL (ref 0.0–0.5)
Eosinophils Relative: 0 %
HCT: 29.8 % — ABNORMAL LOW (ref 39.0–52.0)
Hemoglobin: 10 g/dL — ABNORMAL LOW (ref 13.0–17.0)
Immature Granulocytes: 1 %
Lymphocytes Relative: 9 %
Lymphs Abs: 0.5 10*3/uL — ABNORMAL LOW (ref 0.7–4.0)
MCH: 31.8 pg (ref 26.0–34.0)
MCHC: 33.6 g/dL (ref 30.0–36.0)
MCV: 94.9 fL (ref 80.0–100.0)
Monocytes Absolute: 0.8 10*3/uL (ref 0.1–1.0)
Monocytes Relative: 15 %
Neutro Abs: 3.8 10*3/uL (ref 1.7–7.7)
Neutrophils Relative %: 75 %
Platelet Count: 80 10*3/uL — ABNORMAL LOW (ref 150–400)
RBC: 3.14 MIL/uL — ABNORMAL LOW (ref 4.22–5.81)
RDW: 19.7 % — ABNORMAL HIGH (ref 11.5–15.5)
WBC Count: 5.1 10*3/uL (ref 4.0–10.5)
nRBC: 0 % (ref 0.0–0.2)

## 2022-01-11 LAB — CMP (CANCER CENTER ONLY)
ALT: 43 U/L (ref 0–44)
AST: 23 U/L (ref 15–41)
Albumin: 4 g/dL (ref 3.5–5.0)
Alkaline Phosphatase: 60 U/L (ref 38–126)
Anion gap: 7 (ref 5–15)
BUN: 11 mg/dL (ref 8–23)
CO2: 26 mmol/L (ref 22–32)
Calcium: 9 mg/dL (ref 8.9–10.3)
Chloride: 108 mmol/L (ref 98–111)
Creatinine: 0.67 mg/dL (ref 0.61–1.24)
GFR, Estimated: >60 mL/min (ref >60)
Glucose, Bld: 106 mg/dL — ABNORMAL HIGH (ref 70–99)
Potassium: 4 mmol/L (ref 3.5–5.1)
Sodium: 141 mmol/L (ref 135–145)
Total Bilirubin: 0.4 mg/dL (ref 0.3–1.2)
Total Protein: 6.4 g/dL — ABNORMAL LOW (ref 6.5–8.1)

## 2022-01-11 LAB — SAMPLE TO BLOOD BANK

## 2022-01-11 LAB — MAGNESIUM: Magnesium: 1.8 mg/dL (ref 1.7–2.4)

## 2022-01-11 MED ORDER — HEPARIN SOD (PORK) LOCK FLUSH 100 UNIT/ML IV SOLN
500.0000 [IU] | Freq: Once | INTRAVENOUS | Status: AC
  Administered 2022-01-11: 500 [IU] via INTRAVENOUS

## 2022-01-11 MED ORDER — SODIUM CHLORIDE 0.9% FLUSH
10.0000 mL | Freq: Once | INTRAVENOUS | Status: AC
  Administered 2022-01-11: 10 mL via INTRAVENOUS

## 2022-01-11 MED ORDER — SODIUM CHLORIDE 0.9% FLUSH
10.0000 mL | Freq: Once | INTRAVENOUS | Status: AC
  Administered 2022-01-11: 10 mL

## 2022-01-11 NOTE — Telephone Encounter (Signed)
Patient called requesting assistance with disability paperwork. Advised that a referral to soacial work would be placed.

## 2022-01-11 NOTE — Progress Notes (Signed)
Per Dr Lorenso Courier note, no transfusion needed-- hgb 10.0 and plts 80. Pt asymptomatic and agreeable to no transfusion.

## 2022-01-12 ENCOUNTER — Encounter: Payer: Self-pay | Admitting: *Deleted

## 2022-01-12 NOTE — Progress Notes (Signed)
[place] CSW Progress Notes  Social Work Intern contacted Mr. Shadd by phone per referral from Oncology Nurse. Pt would like assistance filing for SSDI, and has agreed to meet with Intern at Support Service office at his next appointment on Monday 2/6 to sign Authorization to [employer] for [employer]. Intern will assess for SDOH and further needs in person, and send referral to [facility] on 2/6.  Rosary Lively, Social Work Intern Supervised by Gwinda Maine, LCSW

## 2022-01-15 ENCOUNTER — Other Ambulatory Visit: Payer: Self-pay

## 2022-01-15 ENCOUNTER — Telehealth: Payer: Self-pay | Admitting: *Deleted

## 2022-01-15 ENCOUNTER — Inpatient Hospital Stay: Payer: 59

## 2022-01-15 DIAGNOSIS — C9201 Acute myeloblastic leukemia, in remission: Secondary | ICD-10-CM | POA: Diagnosis not present

## 2022-01-15 DIAGNOSIS — Z95828 Presence of other vascular implants and grafts: Secondary | ICD-10-CM

## 2022-01-15 DIAGNOSIS — D61818 Other pancytopenia: Secondary | ICD-10-CM

## 2022-01-15 LAB — CBC WITH DIFFERENTIAL (CANCER CENTER ONLY)
Abs Immature Granulocytes: 0.05 10*3/uL (ref 0.00–0.07)
Basophils Absolute: 0 10*3/uL (ref 0.0–0.1)
Basophils Relative: 0 %
Eosinophils Absolute: 0 10*3/uL (ref 0.0–0.5)
Eosinophils Relative: 0 %
HCT: 30 % — ABNORMAL LOW (ref 39.0–52.0)
Hemoglobin: 10.4 g/dL — ABNORMAL LOW (ref 13.0–17.0)
Immature Granulocytes: 1 %
Lymphocytes Relative: 7 %
Lymphs Abs: 0.6 10*3/uL — ABNORMAL LOW (ref 0.7–4.0)
MCH: 32.8 pg (ref 26.0–34.0)
MCHC: 34.7 g/dL (ref 30.0–36.0)
MCV: 94.6 fL (ref 80.0–100.0)
Monocytes Absolute: 0.6 10*3/uL (ref 0.1–1.0)
Monocytes Relative: 7 %
Neutro Abs: 7.4 10*3/uL (ref 1.7–7.7)
Neutrophils Relative %: 85 %
Platelet Count: 95 10*3/uL — ABNORMAL LOW (ref 150–400)
RBC: 3.17 MIL/uL — ABNORMAL LOW (ref 4.22–5.81)
RDW: 20.8 % — ABNORMAL HIGH (ref 11.5–15.5)
WBC Count: 8.7 10*3/uL (ref 4.0–10.5)
nRBC: 0 % (ref 0.0–0.2)

## 2022-01-15 LAB — CMP (CANCER CENTER ONLY)
ALT: 63 U/L — ABNORMAL HIGH (ref 0–44)
AST: 29 U/L (ref 15–41)
Albumin: 4.2 g/dL (ref 3.5–5.0)
Alkaline Phosphatase: 58 U/L (ref 38–126)
Anion gap: 7 (ref 5–15)
BUN: 13 mg/dL (ref 8–23)
CO2: 27 mmol/L (ref 22–32)
Calcium: 9.1 mg/dL (ref 8.9–10.3)
Chloride: 107 mmol/L (ref 98–111)
Creatinine: 0.76 mg/dL (ref 0.61–1.24)
GFR, Estimated: >60 mL/min (ref >60)
Glucose, Bld: 144 mg/dL — ABNORMAL HIGH (ref 70–99)
Potassium: 3.7 mmol/L (ref 3.5–5.1)
Sodium: 141 mmol/L (ref 135–145)
Total Bilirubin: 0.5 mg/dL (ref 0.3–1.2)
Total Protein: 6.5 g/dL (ref 6.5–8.1)

## 2022-01-15 LAB — MAGNESIUM: Magnesium: 1.9 mg/dL (ref 1.7–2.4)

## 2022-01-15 MED ORDER — SODIUM CHLORIDE 0.9% FLUSH
10.0000 mL | Freq: Once | INTRAVENOUS | Status: AC
  Administered 2022-01-15: 10 mL

## 2022-01-15 NOTE — Telephone Encounter (Signed)
Received call from [facility]. Pt needs a root canal procedure done. They are asking id he needs any antibiotics pre-procedure. Spoke with Dr. Lorenso Courier. Per Dr. Lorenso Courier, pt's CBC is very stable-no pre meds needed. Faith will let the Endodontist know it is ok to proceed with procedure.

## 2022-01-15 NOTE — Progress Notes (Signed)
Per Dr. Lorenso Courier note patient does not need blood today with HGB of 10.4 and platelets of 95. Patient is not experiencing symptoms and is agreeable to no transfusion today.

## 2022-01-16 ENCOUNTER — Encounter: Payer: Self-pay | Admitting: Licensed Clinical Social Worker

## 2022-01-16 NOTE — Progress Notes (Signed)
[place] CSW Progress Note  Patient came to support services requesting help with social security disability. CSW offered referral to [facility] to assist with the process. Pt declined as he has an appt with the social security office on the 14th. He asked about medical records and if he needs to gather them himself. CSW provided education on how social security will request records. Encouraged pt to ensure that he provides information on all medical offices he receives care. Discussed that it can be a long process to determine eligibility.  No other needs at this time.    Christeen Douglas , LCSW

## 2022-01-18 ENCOUNTER — Inpatient Hospital Stay: Payer: 59

## 2022-01-18 ENCOUNTER — Other Ambulatory Visit: Payer: Self-pay

## 2022-01-21 NOTE — Progress Notes (Signed)
rescheduled

## 2022-01-22 ENCOUNTER — Inpatient Hospital Stay: Payer: 59

## 2022-01-22 ENCOUNTER — Inpatient Hospital Stay: Payer: 59 | Admitting: Hematology and Oncology

## 2022-01-22 DIAGNOSIS — Z95828 Presence of other vascular implants and grafts: Secondary | ICD-10-CM

## 2022-01-22 DIAGNOSIS — D61818 Other pancytopenia: Secondary | ICD-10-CM

## 2022-01-22 DIAGNOSIS — C9201 Acute myeloblastic leukemia, in remission: Secondary | ICD-10-CM

## 2022-01-23 ENCOUNTER — Encounter: Payer: Self-pay | Admitting: Hematology and Oncology

## 2022-01-23 ENCOUNTER — Telehealth: Payer: Self-pay | Admitting: *Deleted

## 2022-01-23 NOTE — Telephone Encounter (Signed)
Bryce Reyes from Dr Cyndi Lennert office at [facility] called stating Bryce Reyes will need labs drawn via port every Monday and Thursday beginning 2/20. First appt will be Monday 2/20 @ 0745. Message sent to scheduler. Bryce Reyes will be faxing orders when Dr Mariea Clonts signs them. Her contact is [phone]

## 2022-01-24 ENCOUNTER — Telehealth: Payer: Self-pay | Admitting: Hematology and Oncology

## 2022-01-24 NOTE — Telephone Encounter (Signed)
Sch per 2/14 inbasket, left msg

## 2022-01-29 ENCOUNTER — Inpatient Hospital Stay: Payer: 59

## 2022-01-29 ENCOUNTER — Telehealth: Payer: Self-pay | Admitting: *Deleted

## 2022-01-29 ENCOUNTER — Other Ambulatory Visit: Payer: Self-pay

## 2022-01-29 DIAGNOSIS — D61818 Other pancytopenia: Secondary | ICD-10-CM

## 2022-01-29 DIAGNOSIS — C9201 Acute myeloblastic leukemia, in remission: Secondary | ICD-10-CM | POA: Diagnosis not present

## 2022-01-29 LAB — CMP (CANCER CENTER ONLY)
ALT: 22 U/L (ref 0–44)
AST: 12 U/L — ABNORMAL LOW (ref 15–41)
Albumin: 4.3 g/dL (ref 3.5–5.0)
Alkaline Phosphatase: 62 U/L (ref 38–126)
Anion gap: 8 (ref 5–15)
BUN: 20 mg/dL (ref 8–23)
CO2: 25 mmol/L (ref 22–32)
Calcium: 9 mg/dL (ref 8.9–10.3)
Chloride: 105 mmol/L (ref 98–111)
Creatinine: 0.87 mg/dL (ref 0.61–1.24)
GFR, Estimated: >60 mL/min (ref >60)
Glucose, Bld: 135 mg/dL — ABNORMAL HIGH (ref 70–99)
Potassium: 3.8 mmol/L (ref 3.5–5.1)
Sodium: 138 mmol/L (ref 135–145)
Total Bilirubin: 0.9 mg/dL (ref 0.3–1.2)
Total Protein: 6.8 g/dL (ref 6.5–8.1)

## 2022-01-29 LAB — CBC WITH DIFFERENTIAL (CANCER CENTER ONLY)
Abs Immature Granulocytes: 0 10*3/uL (ref 0.00–0.07)
Band Neutrophils: 6 %
Basophils Absolute: 0 10*3/uL (ref 0.0–0.1)
Basophils Relative: 0 %
Eosinophils Absolute: 0.1 10*3/uL (ref 0.0–0.5)
Eosinophils Relative: 1 %
HCT: 29.7 % — ABNORMAL LOW (ref 39.0–52.0)
Hemoglobin: 10.4 g/dL — ABNORMAL LOW (ref 13.0–17.0)
Lymphocytes Relative: 1 %
Lymphs Abs: 0.1 10*3/uL — ABNORMAL LOW (ref 0.7–4.0)
MCH: 34.3 pg — ABNORMAL HIGH (ref 26.0–34.0)
MCHC: 35 g/dL (ref 30.0–36.0)
MCV: 98 fL (ref 80.0–100.0)
Monocytes Absolute: 0.1 10*3/uL (ref 0.1–1.0)
Monocytes Relative: 1 %
Neutro Abs: 6.6 10*3/uL (ref 1.7–7.7)
Neutrophils Relative %: 91 %
Platelet Count: 46 10*3/uL — ABNORMAL LOW (ref 150–400)
RBC: 3.03 MIL/uL — ABNORMAL LOW (ref 4.22–5.81)
RDW: 19.4 % — ABNORMAL HIGH (ref 11.5–15.5)
Smear Review: NORMAL
WBC Count: 6.8 10*3/uL (ref 4.0–10.5)
nRBC: 0 % (ref 0.0–0.2)

## 2022-01-29 LAB — MAGNESIUM: Magnesium: 1.9 mg/dL (ref 1.7–2.4)

## 2022-01-29 MED ORDER — SODIUM CHLORIDE 0.9% FLUSH
10.0000 mL | Freq: Once | INTRAVENOUS | Status: AC
  Administered 2022-01-29: 10 mL

## 2022-01-29 MED ORDER — HEPARIN SOD (PORK) LOCK FLUSH 100 UNIT/ML IV SOLN
500.0000 [IU] | Freq: Once | INTRAVENOUS | Status: AC
  Administered 2022-01-29: 500 [IU] via INTRAVENOUS

## 2022-01-29 NOTE — Telephone Encounter (Signed)
Received call from pt inquiring about today's lab results. Reviewed lab results with him. He voiced understanding.  He did not need any transfusions today, nor is he neutropenic. Pt voiced understanding.

## 2022-02-01 ENCOUNTER — Other Ambulatory Visit: Payer: Self-pay | Admitting: *Deleted

## 2022-02-01 ENCOUNTER — Other Ambulatory Visit: Payer: Self-pay

## 2022-02-01 ENCOUNTER — Inpatient Hospital Stay: Payer: 59

## 2022-02-01 DIAGNOSIS — C9201 Acute myeloblastic leukemia, in remission: Secondary | ICD-10-CM | POA: Diagnosis not present

## 2022-02-01 DIAGNOSIS — D61818 Other pancytopenia: Secondary | ICD-10-CM

## 2022-02-01 DIAGNOSIS — Z95828 Presence of other vascular implants and grafts: Secondary | ICD-10-CM

## 2022-02-01 LAB — CBC WITH DIFFERENTIAL (CANCER CENTER ONLY)
Abs Immature Granulocytes: 0.01 10*3/uL (ref 0.00–0.07)
Basophils Absolute: 0 10*3/uL (ref 0.0–0.1)
Basophils Relative: 1 %
Eosinophils Absolute: 0 10*3/uL (ref 0.0–0.5)
Eosinophils Relative: 0 %
HCT: 24.9 % — ABNORMAL LOW (ref 39.0–52.0)
Hemoglobin: 8.9 g/dL — ABNORMAL LOW (ref 13.0–17.0)
Immature Granulocytes: 1 %
Lymphocytes Relative: 8 %
Lymphs Abs: 0.1 10*3/uL — ABNORMAL LOW (ref 0.7–4.0)
MCH: 35 pg — ABNORMAL HIGH (ref 26.0–34.0)
MCHC: 35.7 g/dL (ref 30.0–36.0)
MCV: 98 fL (ref 80.0–100.0)
Monocytes Absolute: 0 10*3/uL — ABNORMAL LOW (ref 0.1–1.0)
Monocytes Relative: 2 %
Neutro Abs: 1.3 10*3/uL — ABNORMAL LOW (ref 1.7–7.7)
Neutrophils Relative %: 88 %
Platelet Count: 10 10*3/uL — ABNORMAL LOW (ref 150–400)
RBC: 2.54 MIL/uL — ABNORMAL LOW (ref 4.22–5.81)
RDW: 18.3 % — ABNORMAL HIGH (ref 11.5–15.5)
Smear Review: NORMAL
WBC Count: 1.5 10*3/uL — ABNORMAL LOW (ref 4.0–10.5)
nRBC: 0 % (ref 0.0–0.2)

## 2022-02-01 LAB — CMP (CANCER CENTER ONLY)
ALT: 19 U/L (ref 0–44)
AST: 11 U/L — ABNORMAL LOW (ref 15–41)
Albumin: 4.1 g/dL (ref 3.5–5.0)
Alkaline Phosphatase: 59 U/L (ref 38–126)
Anion gap: 7 (ref 5–15)
BUN: 24 mg/dL — ABNORMAL HIGH (ref 8–23)
CO2: 25 mmol/L (ref 22–32)
Calcium: 8.8 mg/dL — ABNORMAL LOW (ref 8.9–10.3)
Chloride: 104 mmol/L (ref 98–111)
Creatinine: 0.78 mg/dL (ref 0.61–1.24)
GFR, Estimated: >60 mL/min (ref >60)
Glucose, Bld: 139 mg/dL — ABNORMAL HIGH (ref 70–99)
Potassium: 3.9 mmol/L (ref 3.5–5.1)
Sodium: 136 mmol/L (ref 135–145)
Total Bilirubin: 0.7 mg/dL (ref 0.3–1.2)
Total Protein: 6.4 g/dL — ABNORMAL LOW (ref 6.5–8.1)

## 2022-02-01 LAB — MAGNESIUM: Magnesium: 2 mg/dL (ref 1.7–2.4)

## 2022-02-01 LAB — PREPARE RBC (CROSSMATCH)

## 2022-02-01 MED ORDER — SODIUM CHLORIDE 0.9% FLUSH
10.0000 mL | Freq: Once | INTRAVENOUS | Status: AC
  Administered 2022-02-01: 10 mL

## 2022-02-01 MED ORDER — HEPARIN SOD (PORK) LOCK FLUSH 100 UNIT/ML IV SOLN
500.0000 [IU] | Freq: Once | INTRAVENOUS | Status: AC
  Administered 2022-02-01: 500 [IU] via INTRAVENOUS

## 2022-02-01 NOTE — Addendum Note (Signed)
Addended by: Elizebeth Brooking on: 02/01/2022 09:38 AM   Modules accepted: Orders

## 2022-02-01 NOTE — Progress Notes (Signed)
Patient requested to  ?

## 2022-02-02 ENCOUNTER — Inpatient Hospital Stay: Payer: 59

## 2022-02-02 DIAGNOSIS — C9201 Acute myeloblastic leukemia, in remission: Secondary | ICD-10-CM | POA: Diagnosis not present

## 2022-02-02 MED ORDER — SODIUM CHLORIDE 0.9% IV SOLUTION
250.0000 mL | Freq: Once | INTRAVENOUS | Status: AC
  Administered 2022-02-02: 250 mL via INTRAVENOUS

## 2022-02-02 MED ORDER — SODIUM CHLORIDE 0.9% FLUSH
10.0000 mL | INTRAVENOUS | Status: AC | PRN
  Administered 2022-02-02: 10 mL

## 2022-02-02 MED ORDER — HEPARIN SOD (PORK) LOCK FLUSH 100 UNIT/ML IV SOLN
500.0000 [IU] | Freq: Every day | INTRAVENOUS | Status: AC | PRN
  Administered 2022-02-02: 500 [IU]

## 2022-02-02 MED ORDER — ACETAMINOPHEN 325 MG PO TABS
650.0000 mg | ORAL_TABLET | Freq: Once | ORAL | Status: AC
  Administered 2022-02-02: 650 mg via ORAL
  Filled 2022-02-02: qty 2

## 2022-02-02 NOTE — Patient Instructions (Signed)

## 2022-02-05 ENCOUNTER — Inpatient Hospital Stay: Payer: 59

## 2022-02-05 ENCOUNTER — Telehealth: Payer: Self-pay | Admitting: *Deleted

## 2022-02-05 ENCOUNTER — Other Ambulatory Visit: Payer: Self-pay

## 2022-02-05 ENCOUNTER — Telehealth: Payer: Self-pay

## 2022-02-05 ENCOUNTER — Other Ambulatory Visit: Payer: Self-pay | Admitting: *Deleted

## 2022-02-05 DIAGNOSIS — C9201 Acute myeloblastic leukemia, in remission: Secondary | ICD-10-CM

## 2022-02-05 DIAGNOSIS — D61818 Other pancytopenia: Secondary | ICD-10-CM

## 2022-02-05 LAB — TYPE AND SCREEN
ABO/RH(D): B NEG
Antibody Screen: NEGATIVE
Unit division: 0

## 2022-02-05 LAB — CBC WITH DIFFERENTIAL (CANCER CENTER ONLY)
Abs Immature Granulocytes: 0.02 10*3/uL (ref 0.00–0.07)
Basophils Absolute: 0 10*3/uL (ref 0.0–0.1)
Basophils Relative: 0 %
Eosinophils Absolute: 0 10*3/uL (ref 0.0–0.5)
Eosinophils Relative: 0 %
HCT: 27.3 % — ABNORMAL LOW (ref 39.0–52.0)
Hemoglobin: 9.6 g/dL — ABNORMAL LOW (ref 13.0–17.0)
Immature Granulocytes: 5 %
Lymphocytes Relative: 44 %
Lymphs Abs: 0.2 10*3/uL — ABNORMAL LOW (ref 0.7–4.0)
MCH: 33.1 pg (ref 26.0–34.0)
MCHC: 35.2 g/dL (ref 30.0–36.0)
MCV: 94.1 fL (ref 80.0–100.0)
Monocytes Absolute: 0.1 10*3/uL (ref 0.1–1.0)
Monocytes Relative: 18 %
Neutro Abs: 0.1 10*3/uL — CL (ref 1.7–7.7)
Neutrophils Relative %: 33 %
Platelet Count: 5 10*3/uL — CL (ref 150–400)
RBC: 2.9 MIL/uL — ABNORMAL LOW (ref 4.22–5.81)
RDW: 17.1 % — ABNORMAL HIGH (ref 11.5–15.5)
WBC Count: 0.4 10*3/uL — CL (ref 4.0–10.5)
nRBC: 0 % (ref 0.0–0.2)

## 2022-02-05 LAB — CMP (CANCER CENTER ONLY)
ALT: 15 U/L (ref 0–44)
AST: 11 U/L — ABNORMAL LOW (ref 15–41)
Albumin: 4 g/dL (ref 3.5–5.0)
Alkaline Phosphatase: 52 U/L (ref 38–126)
Anion gap: 8 (ref 5–15)
BUN: 22 mg/dL (ref 8–23)
CO2: 24 mmol/L (ref 22–32)
Calcium: 8.9 mg/dL (ref 8.9–10.3)
Chloride: 105 mmol/L (ref 98–111)
Creatinine: 1.03 mg/dL (ref 0.61–1.24)
GFR, Estimated: >60 mL/min (ref >60)
Glucose, Bld: 130 mg/dL — ABNORMAL HIGH (ref 70–99)
Potassium: 3.9 mmol/L (ref 3.5–5.1)
Sodium: 137 mmol/L (ref 135–145)
Total Bilirubin: 0.5 mg/dL (ref 0.3–1.2)
Total Protein: 6.7 g/dL (ref 6.5–8.1)

## 2022-02-05 LAB — BPAM PLATELET PHERESIS
Blood Product Expiration Date: 202302252359
ISSUE DATE / TIME: 202302241224
Unit Type and Rh: 5100

## 2022-02-05 LAB — SAMPLE TO BLOOD BANK

## 2022-02-05 LAB — BPAM RBC
Blood Product Expiration Date: 202303202359
ISSUE DATE / TIME: 202302241220
Unit Type and Rh: 1700

## 2022-02-05 LAB — PREPARE PLATELET PHERESIS: Unit division: 0

## 2022-02-05 LAB — MAGNESIUM: Magnesium: 1.8 mg/dL (ref 1.7–2.4)

## 2022-02-05 MED ORDER — HEPARIN SOD (PORK) LOCK FLUSH 100 UNIT/ML IV SOLN
500.0000 [IU] | Freq: Every day | INTRAVENOUS | Status: AC | PRN
  Administered 2022-02-05: 500 [IU]

## 2022-02-05 MED ORDER — ACETAMINOPHEN 325 MG PO TABS
650.0000 mg | ORAL_TABLET | Freq: Once | ORAL | Status: AC
  Administered 2022-02-05: 650 mg via ORAL
  Filled 2022-02-05: qty 2

## 2022-02-05 MED ORDER — SODIUM CHLORIDE 0.9% FLUSH
10.0000 mL | INTRAVENOUS | Status: AC | PRN
  Administered 2022-02-05: 10 mL

## 2022-02-05 MED ORDER — SODIUM CHLORIDE 0.9% IV SOLUTION
250.0000 mL | Freq: Once | INTRAVENOUS | Status: AC
  Administered 2022-02-05: 250 mL via INTRAVENOUS

## 2022-02-05 NOTE — Telephone Encounter (Signed)
Platelet count is 5k today. Pt to get 2 units of platelets today per [facility] parameters. Pt is AML and gets in patient chemo @ [facility] every 3-4 weeks  Dr. Lorenso Courier aware.  Platelet orders in and blood bank notified. Infusion and patient aware as well.

## 2022-02-05 NOTE — Patient Instructions (Signed)
Platelet Transfusion ?A platelet transfusion is a procedure in which a person receives donated platelets through an IV. Platelets are parts of blood that stick together and form a clot to help the body stop bleeding after an injury. If you have too few platelets, your blood may have trouble clotting. This may cause you to bleed and bruise very easily. ?You may need a platelet transfusion if you have a condition that causes a low number of platelets (thrombocytopenia). A platelet transfusion may be used to stop or prevent excessive bleeding. ?Tell a health care provider about: ?Any reactions you have had during previous transfusions. ?Any allergies you have. ?All medicines you are taking, including vitamins, herbs, eye drops, creams, and over-the-counter medicines. ?Any bleeding problems you have. ?Any surgeries you have had. ?Any medical conditions you have. ?Whether you are pregnant or may be pregnant. ?What are the risks? ?Generally, this is a safe procedure. However, problems may occur, including: ?Fever. ?Infection. ?Allergic reaction to the donated (donor) platelets. ?Your body's disease-fighting system (immune system) attacking the donor platelets (hemolytic reaction). This is rare. ?A rare reaction that causes lung damage (transfusion-related acute lung injury). ?What happens before the procedure? ?Medicines ?Ask your health care provider about: ?Changing or stopping your regular medicines. This is especially important if you are taking diabetes medicines or blood thinners. ?Taking medicines such as aspirin and ibuprofen. These medicines can thin your blood. Do not take these medicines unless your health care provider tells you to take them. ?Taking over-the-counter medicines, vitamins, herbs, and supplements. ?General instructions ?You will have a blood test to determine your blood type. Your blood type determines what kind of platelets you will be given. ?Follow instructions from your health care provider  about eating or drinking restrictions. ?If you have had an allergic reaction to a transfusion in the past, you may be given medicine to help prevent a reaction. ?Your temperature, blood pressure, pulse, and breathing will be monitored. ?What happens during the procedure? ? ?An IV will be inserted into one of your veins. ?For your safety, two health care providers will verify your identity along with the donor platelets about to be infused. ?A bag of donor platelets will be connected to your IV. The platelets will flow into your bloodstream. This usually takes 30-60 minutes. ?Your temperature, blood pressure, pulse, and breathing will be monitored during the transfusion. This helps detect early signs of any reaction. ?You will also be monitored for other symptoms that may indicate a reaction, including chills, hives, or itching. ?If you have signs of a reaction at any time, your transfusion will be stopped, and you may be given medicine to help manage the reaction. ?When your transfusion is complete, your IV will be removed. ?Pressure may be applied to the IV site for a few minutes to stop any bleeding. ?The IV site will be covered with a bandage (dressing). ?The procedure may vary among health care providers and hospitals. ?What can I expect after the procedure? ?Your blood pressure, temperature, pulse, and breathing will be monitored until you leave the hospital or clinic. ?You may have some bruising and soreness at your IV site. ?Follow these instructions at home: ?Medicines ?Take over-the-counter and prescription medicines only as told by your health care provider. ?Talk with your health care provider before you take any medicines that contain aspirin or NSAIDs, such as ibuprofen. These medicines increase your risk for dangerous bleeding. ?IV site care ?Check your IV site every day for signs of infection. Check for: ?  Redness, swelling, or pain. ?Fluid or blood. If fluid or blood drains from your IV site, use your  hands to press down firmly on a bandage covering the area for a minute or two. Doing this should stop the bleeding. ?Warmth. ?Pus or a bad smell. ?General instructions ?Change or remove your dressing as told by your health care provider. ?Return to your normal activities as told by your health care provider. Ask your health care provider what activities are safe for you. ?Do not take baths, swim, or use a hot tub until your health care provider approves. Ask your health care provider if you may take showers. ?Keep all follow-up visits. This is important. ?Contact a health care provider if: ?You have a headache that does not go away with medicine. ?You have hives, rash, or itchy skin. ?You have nausea or vomiting. ?You feel unusually tired or weak. ?You have signs of infection at your IV site. ?Get help right away if: ?You have a fever or chills. ?You urinate less often than usual. ?Your urine is darker colored than normal. ?You have any of the following: ?Trouble breathing. ?Pain in your back, abdomen, or chest. ?Cool, clammy skin. ?A fast heartbeat. ?Summary ?Platelets are tiny pieces of blood cells that clump together to form a blood clot when you have an injury. If you have too few platelets, your blood may have trouble clotting. ?A platelet transfusion is a procedure in which you receive donated platelets through an IV. ?A platelet transfusion may be used to stop or prevent excessive bleeding. ?After the procedure, check your IV site every day for signs of infection. ?This information is not intended to replace advice given to you by your health care provider. Make sure you discuss any questions you have with your health care provider. ?Document Revised: 06/01/2021 Document Reviewed: 06/01/2021 ?Elsevier Patient Education ? 2022 Elsevier Inc. ? ?

## 2022-02-05 NOTE — Telephone Encounter (Signed)
CRITICAL VALUE STICKER  CRITICAL VALUE: WBC  0.4  K/uL  , Platelet 5 K/uL  RECEIVER (on-site recipient of call):Yanely Mast RN  DATE & TIME NOTIFIED: 02/05/22  5800  MESSENGER (representative from lab): Pam  MD NOTIFIED: Dr. Lorenso Courier  TIME OF NOTIFICATION:  [phone]  RESPONSE:  OK

## 2022-02-05 NOTE — Patient Instructions (Signed)

## 2022-02-06 LAB — PREPARE PLATELET PHERESIS
Unit division: 0
Unit division: 0

## 2022-02-06 LAB — BPAM PLATELET PHERESIS
Blood Product Expiration Date: 202302272359
Blood Product Expiration Date: 202302282359
ISSUE DATE / TIME: 202302270951
ISSUE DATE / TIME: 202302271040
Unit Type and Rh: 6200
Unit Type and Rh: 6200

## 2022-02-08 ENCOUNTER — Other Ambulatory Visit: Payer: Self-pay | Admitting: *Deleted

## 2022-02-08 ENCOUNTER — Other Ambulatory Visit: Payer: Self-pay

## 2022-02-08 ENCOUNTER — Inpatient Hospital Stay: Payer: 59

## 2022-02-08 ENCOUNTER — Inpatient Hospital Stay: Payer: 59 | Attending: Hematology and Oncology

## 2022-02-08 ENCOUNTER — Other Ambulatory Visit: Payer: Self-pay | Admitting: Hematology and Oncology

## 2022-02-08 DIAGNOSIS — C9201 Acute myeloblastic leukemia, in remission: Secondary | ICD-10-CM | POA: Diagnosis present

## 2022-02-08 DIAGNOSIS — D61818 Other pancytopenia: Secondary | ICD-10-CM

## 2022-02-08 DIAGNOSIS — Z95828 Presence of other vascular implants and grafts: Secondary | ICD-10-CM

## 2022-02-08 LAB — CMP (CANCER CENTER ONLY)
ALT: 25 U/L (ref 0–44)
AST: 16 U/L (ref 15–41)
Albumin: 4.1 g/dL (ref 3.5–5.0)
Alkaline Phosphatase: 60 U/L (ref 38–126)
Anion gap: 8 (ref 5–15)
BUN: 19 mg/dL (ref 8–23)
CO2: 24 mmol/L (ref 22–32)
Calcium: 9.1 mg/dL (ref 8.9–10.3)
Chloride: 105 mmol/L (ref 98–111)
Creatinine: 0.85 mg/dL (ref 0.61–1.24)
GFR, Estimated: >60 mL/min (ref >60)
Glucose, Bld: 121 mg/dL — ABNORMAL HIGH (ref 70–99)
Potassium: 3.7 mmol/L (ref 3.5–5.1)
Sodium: 137 mmol/L (ref 135–145)
Total Bilirubin: 0.3 mg/dL (ref 0.3–1.2)
Total Protein: 6.7 g/dL (ref 6.5–8.1)

## 2022-02-08 LAB — CBC WITH DIFFERENTIAL (CANCER CENTER ONLY)
Abs Immature Granulocytes: 0.02 10*3/uL (ref 0.00–0.07)
Basophils Absolute: 0 10*3/uL (ref 0.0–0.1)
Basophils Relative: 1 %
Eosinophils Absolute: 0 10*3/uL (ref 0.0–0.5)
Eosinophils Relative: 0 %
HCT: 25.6 % — ABNORMAL LOW (ref 39.0–52.0)
Hemoglobin: 9.1 g/dL — ABNORMAL LOW (ref 13.0–17.0)
Immature Granulocytes: 1 %
Lymphocytes Relative: 10 %
Lymphs Abs: 0.2 10*3/uL — ABNORMAL LOW (ref 0.7–4.0)
MCH: 33.8 pg (ref 26.0–34.0)
MCHC: 35.5 g/dL (ref 30.0–36.0)
MCV: 95.2 fL (ref 80.0–100.0)
Monocytes Absolute: 0.3 10*3/uL (ref 0.1–1.0)
Monocytes Relative: 13 %
Neutro Abs: 1.7 10*3/uL (ref 1.7–7.7)
Neutrophils Relative %: 75 %
Platelet Count: 19 10*3/uL — ABNORMAL LOW (ref 150–400)
RBC: 2.69 MIL/uL — ABNORMAL LOW (ref 4.22–5.81)
RDW: 17 % — ABNORMAL HIGH (ref 11.5–15.5)
Smear Review: NORMAL
WBC Count: 2.2 10*3/uL — ABNORMAL LOW (ref 4.0–10.5)
nRBC: 0 % (ref 0.0–0.2)

## 2022-02-08 LAB — SAMPLE TO BLOOD BANK

## 2022-02-08 LAB — MAGNESIUM: Magnesium: 1.9 mg/dL (ref 1.7–2.4)

## 2022-02-08 MED ORDER — HEPARIN SOD (PORK) LOCK FLUSH 100 UNIT/ML IV SOLN
500.0000 [IU] | INTRAVENOUS | Status: AC | PRN
  Administered 2022-02-08: 500 [IU]

## 2022-02-08 MED ORDER — SODIUM CHLORIDE 0.9% IV SOLUTION
250.0000 mL | Freq: Once | INTRAVENOUS | Status: AC
  Administered 2022-02-08: 250 mL via INTRAVENOUS

## 2022-02-08 MED ORDER — ACETAMINOPHEN 325 MG PO TABS
650.0000 mg | ORAL_TABLET | Freq: Once | ORAL | Status: AC
  Administered 2022-02-08: 650 mg via ORAL
  Filled 2022-02-08: qty 2

## 2022-02-08 MED ORDER — SODIUM CHLORIDE 0.9% FLUSH
10.0000 mL | INTRAVENOUS | Status: AC | PRN
  Administered 2022-02-08: 10 mL

## 2022-02-08 MED ORDER — SODIUM CHLORIDE 0.9% FLUSH
10.0000 mL | Freq: Once | INTRAVENOUS | Status: AC
  Administered 2022-02-08: 10 mL

## 2022-02-08 MED ORDER — HEPARIN SOD (PORK) LOCK FLUSH 100 UNIT/ML IV SOLN
500.0000 [IU] | Freq: Every day | INTRAVENOUS | Status: AC | PRN
  Administered 2022-02-08: 500 [IU]

## 2022-02-08 NOTE — Patient Instructions (Signed)
Platelet Transfusion ?A platelet transfusion is a procedure in which a person receives donated platelets through an IV. Platelets are parts of blood that stick together and form a clot to help the body stop bleeding after an injury. If you have too few platelets, your blood may have trouble clotting. This may cause you to bleed and bruise very easily. ?You may need a platelet transfusion if you have a condition that causes a low number of platelets (thrombocytopenia). A platelet transfusion may be used to stop or prevent excessive bleeding. ?Tell a health care provider about: ?Any reactions you have had during previous transfusions. ?Any allergies you have. ?All medicines you are taking, including vitamins, herbs, eye drops, creams, and over-the-counter medicines. ?Any bleeding problems you have. ?Any surgeries you have had. ?Any medical conditions you have. ?Whether you are pregnant or may be pregnant. ?What are the risks? ?Generally, this is a safe procedure. However, problems may occur, including: ?Fever. ?Infection. ?Allergic reaction to the donated (donor) platelets. ?Your body's disease-fighting system (immune system) attacking the donor platelets (hemolytic reaction). This is rare. ?A rare reaction that causes lung damage (transfusion-related acute lung injury). ?What happens before the procedure? ?Medicines ?Ask your health care provider about: ?Changing or stopping your regular medicines. This is especially important if you are taking diabetes medicines or blood thinners. ?Taking medicines such as aspirin and ibuprofen. These medicines can thin your blood. Do not take these medicines unless your health care provider tells you to take them. ?Taking over-the-counter medicines, vitamins, herbs, and supplements. ?General instructions ?You will have a blood test to determine your blood type. Your blood type determines what kind of platelets you will be given. ?Follow instructions from your health care provider  about eating or drinking restrictions. ?If you have had an allergic reaction to a transfusion in the past, you may be given medicine to help prevent a reaction. ?Your temperature, blood pressure, pulse, and breathing will be monitored. ?What happens during the procedure? ? ?An IV will be inserted into one of your veins. ?For your safety, two health care providers will verify your identity along with the donor platelets about to be infused. ?A bag of donor platelets will be connected to your IV. The platelets will flow into your bloodstream. This usually takes 30-60 minutes. ?Your temperature, blood pressure, pulse, and breathing will be monitored during the transfusion. This helps detect early signs of any reaction. ?You will also be monitored for other symptoms that may indicate a reaction, including chills, hives, or itching. ?If you have signs of a reaction at any time, your transfusion will be stopped, and you may be given medicine to help manage the reaction. ?When your transfusion is complete, your IV will be removed. ?Pressure may be applied to the IV site for a few minutes to stop any bleeding. ?The IV site will be covered with a bandage (dressing). ?The procedure may vary among health care providers and hospitals. ?What can I expect after the procedure? ?Your blood pressure, temperature, pulse, and breathing will be monitored until you leave the hospital or clinic. ?You may have some bruising and soreness at your IV site. ?Follow these instructions at home: ?Medicines ?Take over-the-counter and prescription medicines only as told by your health care provider. ?Talk with your health care provider before you take any medicines that contain aspirin or NSAIDs, such as ibuprofen. These medicines increase your risk for dangerous bleeding. ?IV site care ?Check your IV site every day for signs of infection. Check for: ?  Redness, swelling, or pain. ?Fluid or blood. If fluid or blood drains from your IV site, use your  hands to press down firmly on a bandage covering the area for a minute or two. Doing this should stop the bleeding. ?Warmth. ?Pus or a bad smell. ?General instructions ?Change or remove your dressing as told by your health care provider. ?Return to your normal activities as told by your health care provider. Ask your health care provider what activities are safe for you. ?Do not take baths, swim, or use a hot tub until your health care provider approves. Ask your health care provider if you may take showers. ?Keep all follow-up visits. This is important. ?Contact a health care provider if: ?You have a headache that does not go away with medicine. ?You have hives, rash, or itchy skin. ?You have nausea or vomiting. ?You feel unusually tired or weak. ?You have signs of infection at your IV site. ?Get help right away if: ?You have a fever or chills. ?You urinate less often than usual. ?Your urine is darker colored than normal. ?You have any of the following: ?Trouble breathing. ?Pain in your back, abdomen, or chest. ?Cool, clammy skin. ?A fast heartbeat. ?Summary ?Platelets are tiny pieces of blood cells that clump together to form a blood clot when you have an injury. If you have too few platelets, your blood may have trouble clotting. ?A platelet transfusion is a procedure in which you receive donated platelets through an IV. ?A platelet transfusion may be used to stop or prevent excessive bleeding. ?After the procedure, check your IV site every day for signs of infection. ?This information is not intended to replace advice given to you by your health care provider. Make sure you discuss any questions you have with your health care provider. ?Document Revised: 06/01/2021 Document Reviewed: 06/01/2021 ?Elsevier Patient Education ? 2022 Elsevier Inc. ? ?

## 2022-02-09 LAB — PREPARE PLATELET PHERESIS: Unit division: 0

## 2022-02-09 LAB — BPAM PLATELET PHERESIS
Blood Product Expiration Date: 202303022359
ISSUE DATE / TIME: 202303021157
Unit Type and Rh: 5100

## 2022-02-12 ENCOUNTER — Other Ambulatory Visit: Payer: Self-pay

## 2022-02-12 ENCOUNTER — Inpatient Hospital Stay: Payer: 59

## 2022-02-12 ENCOUNTER — Other Ambulatory Visit: Payer: Self-pay | Admitting: *Deleted

## 2022-02-12 DIAGNOSIS — Z95828 Presence of other vascular implants and grafts: Secondary | ICD-10-CM

## 2022-02-12 DIAGNOSIS — D61818 Other pancytopenia: Secondary | ICD-10-CM

## 2022-02-12 DIAGNOSIS — C9201 Acute myeloblastic leukemia, in remission: Secondary | ICD-10-CM

## 2022-02-12 MED ORDER — HEPARIN SOD (PORK) LOCK FLUSH 100 UNIT/ML IV SOLN
500.0000 [IU] | Freq: Every day | INTRAVENOUS | Status: AC | PRN
  Administered 2022-02-12: 500 [IU]

## 2022-02-12 MED ORDER — SODIUM CHLORIDE 0.9% IV SOLUTION
250.0000 mL | Freq: Once | INTRAVENOUS | Status: AC
  Administered 2022-02-12: 250 mL via INTRAVENOUS

## 2022-02-12 MED ORDER — SODIUM CHLORIDE 0.9% FLUSH
10.0000 mL | INTRAVENOUS | Status: AC | PRN
  Administered 2022-02-12: 10 mL

## 2022-02-12 MED ORDER — ACETAMINOPHEN 325 MG PO TABS
650.0000 mg | ORAL_TABLET | Freq: Once | ORAL | Status: AC
  Administered 2022-02-12: 650 mg via ORAL
  Filled 2022-02-12: qty 2

## 2022-02-12 NOTE — Patient Instructions (Signed)
Platelet Transfusion ?A platelet transfusion is a procedure in which a person receives donated platelets through an IV. Platelets are parts of blood that stick together and form a clot to help the body stop bleeding after an injury. If you have too few platelets, your blood may have trouble clotting. This may cause you to bleed and bruise very easily. ?You may need a platelet transfusion if you have a condition that causes a low number of platelets (thrombocytopenia). A platelet transfusion may be used to stop or prevent excessive bleeding. ?Tell a health care provider about: ?Any reactions you have had during previous transfusions. ?Any allergies you have. ?All medicines you are taking, including vitamins, herbs, eye drops, creams, and over-the-counter medicines. ?Any bleeding problems you have. ?Any surgeries you have had. ?Any medical conditions you have. ?Whether you are pregnant or may be pregnant. ?What are the risks? ?Generally, this is a safe procedure. However, problems may occur, including: ?Fever. ?Infection. ?Allergic reaction to the donated (donor) platelets. ?Your body's disease-fighting system (immune system) attacking the donor platelets (hemolytic reaction). This is rare. ?A rare reaction that causes lung damage (transfusion-related acute lung injury). ?What happens before the procedure? ?Medicines ?Ask your health care provider about: ?Changing or stopping your regular medicines. This is especially important if you are taking diabetes medicines or blood thinners. ?Taking medicines such as aspirin and ibuprofen. These medicines can thin your blood. Do not take these medicines unless your health care provider tells you to take them. ?Taking over-the-counter medicines, vitamins, herbs, and supplements. ?General instructions ?You will have a blood test to determine your blood type. Your blood type determines what kind of platelets you will be given. ?Follow instructions from your health care provider  about eating or drinking restrictions. ?If you have had an allergic reaction to a transfusion in the past, you may be given medicine to help prevent a reaction. ?Your temperature, blood pressure, pulse, and breathing will be monitored. ?What happens during the procedure? ? ?An IV will be inserted into one of your veins. ?For your safety, two health care providers will verify your identity along with the donor platelets about to be infused. ?A bag of donor platelets will be connected to your IV. The platelets will flow into your bloodstream. This usually takes 30-60 minutes. ?Your temperature, blood pressure, pulse, and breathing will be monitored during the transfusion. This helps detect early signs of any reaction. ?You will also be monitored for other symptoms that may indicate a reaction, including chills, hives, or itching. ?If you have signs of a reaction at any time, your transfusion will be stopped, and you may be given medicine to help manage the reaction. ?When your transfusion is complete, your IV will be removed. ?Pressure may be applied to the IV site for a few minutes to stop any bleeding. ?The IV site will be covered with a bandage (dressing). ?The procedure may vary among health care providers and hospitals. ?What can I expect after the procedure? ?Your blood pressure, temperature, pulse, and breathing will be monitored until you leave the hospital or clinic. ?You may have some bruising and soreness at your IV site. ?Follow these instructions at home: ?Medicines ?Take over-the-counter and prescription medicines only as told by your health care provider. ?Talk with your health care provider before you take any medicines that contain aspirin or NSAIDs, such as ibuprofen. These medicines increase your risk for dangerous bleeding. ?IV site care ?Check your IV site every day for signs of infection. Check for: ?  Redness, swelling, or pain. ?Fluid or blood. If fluid or blood drains from your IV site, use your  hands to press down firmly on a bandage covering the area for a minute or two. Doing this should stop the bleeding. ?Warmth. ?Pus or a bad smell. ?General instructions ?Change or remove your dressing as told by your health care provider. ?Return to your normal activities as told by your health care provider. Ask your health care provider what activities are safe for you. ?Do not take baths, swim, or use a hot tub until your health care provider approves. Ask your health care provider if you may take showers. ?Keep all follow-up visits. This is important. ?Contact a health care provider if: ?You have a headache that does not go away with medicine. ?You have hives, rash, or itchy skin. ?You have nausea or vomiting. ?You feel unusually tired or weak. ?You have signs of infection at your IV site. ?Get help right away if: ?You have a fever or chills. ?You urinate less often than usual. ?Your urine is darker colored than normal. ?You have any of the following: ?Trouble breathing. ?Pain in your back, abdomen, or chest. ?Cool, clammy skin. ?A fast heartbeat. ?Summary ?Platelets are tiny pieces of blood cells that clump together to form a blood clot when you have an injury. If you have too few platelets, your blood may have trouble clotting. ?A platelet transfusion is a procedure in which you receive donated platelets through an IV. ?A platelet transfusion may be used to stop or prevent excessive bleeding. ?After the procedure, check your IV site every day for signs of infection. ?This information is not intended to replace advice given to you by your health care provider. Make sure you discuss any questions you have with your health care provider. ?Document Revised: 06/01/2021 Document Reviewed: 06/01/2021 ?Elsevier Patient Education ? 2022 Elsevier Inc. ? ?

## 2022-02-12 NOTE — Progress Notes (Signed)
Per Dr. Lorenso Courier - patient is to receive 1 unit of platelets for recent platelet count of 19. ?

## 2022-02-13 ENCOUNTER — Telehealth: Payer: Self-pay | Admitting: Hematology and Oncology

## 2022-02-13 LAB — BPAM PLATELET PHERESIS
Blood Product Expiration Date: 202303072359
ISSUE DATE / TIME: 202303060858
Unit Type and Rh: 7300

## 2022-02-13 LAB — PREPARE PLATELET PHERESIS: Unit division: 0

## 2022-02-13 NOTE — Telephone Encounter (Signed)
Appt r/s per 3/7 secure chat with asst nursing director, appts r/s on 3/13. Pt request to cancel 3/9 Lab + Blood-3hr appt, 3/9 appts cancelled. ?

## 2022-02-15 ENCOUNTER — Inpatient Hospital Stay: Payer: 59

## 2022-02-19 ENCOUNTER — Other Ambulatory Visit: Payer: Self-pay

## 2022-02-19 ENCOUNTER — Inpatient Hospital Stay: Payer: 59

## 2022-02-19 DIAGNOSIS — C9201 Acute myeloblastic leukemia, in remission: Secondary | ICD-10-CM | POA: Diagnosis not present

## 2022-02-19 DIAGNOSIS — Z95828 Presence of other vascular implants and grafts: Secondary | ICD-10-CM

## 2022-02-19 DIAGNOSIS — D61818 Other pancytopenia: Secondary | ICD-10-CM

## 2022-02-19 LAB — CBC WITH DIFFERENTIAL (CANCER CENTER ONLY)
Abs Immature Granulocytes: 0.02 10*3/uL (ref 0.00–0.07)
Basophils Absolute: 0 10*3/uL (ref 0.0–0.1)
Basophils Relative: 0 %
Eosinophils Absolute: 0 10*3/uL (ref 0.0–0.5)
Eosinophils Relative: 0 %
HCT: 24.5 % — ABNORMAL LOW (ref 39.0–52.0)
Hemoglobin: 8.5 g/dL — ABNORMAL LOW (ref 13.0–17.0)
Immature Granulocytes: 1 %
Lymphocytes Relative: 8 %
Lymphs Abs: 0.3 10*3/uL — ABNORMAL LOW (ref 0.7–4.0)
MCH: 33.2 pg (ref 26.0–34.0)
MCHC: 34.7 g/dL (ref 30.0–36.0)
MCV: 95.7 fL (ref 80.0–100.0)
Monocytes Absolute: 0.7 10*3/uL (ref 0.1–1.0)
Monocytes Relative: 16 %
Neutro Abs: 3.2 10*3/uL (ref 1.7–7.7)
Neutrophils Relative %: 75 %
Platelet Count: 28 10*3/uL — ABNORMAL LOW (ref 150–400)
RBC: 2.56 MIL/uL — ABNORMAL LOW (ref 4.22–5.81)
RDW: 17 % — ABNORMAL HIGH (ref 11.5–15.5)
WBC Count: 4.2 10*3/uL (ref 4.0–10.5)
nRBC: 0 % (ref 0.0–0.2)

## 2022-02-19 LAB — CMP (CANCER CENTER ONLY)
ALT: 43 U/L (ref 0–44)
AST: 19 U/L (ref 15–41)
Albumin: 4.1 g/dL (ref 3.5–5.0)
Alkaline Phosphatase: 61 U/L (ref 38–126)
Anion gap: 7 (ref 5–15)
BUN: 13 mg/dL (ref 8–23)
CO2: 27 mmol/L (ref 22–32)
Calcium: 9.1 mg/dL (ref 8.9–10.3)
Chloride: 109 mmol/L (ref 98–111)
Creatinine: 0.68 mg/dL (ref 0.61–1.24)
GFR, Estimated: >60 mL/min (ref >60)
Glucose, Bld: 135 mg/dL — ABNORMAL HIGH (ref 70–99)
Potassium: 3.6 mmol/L (ref 3.5–5.1)
Sodium: 143 mmol/L (ref 135–145)
Total Bilirubin: 0.4 mg/dL (ref 0.3–1.2)
Total Protein: 6.4 g/dL — ABNORMAL LOW (ref 6.5–8.1)

## 2022-02-19 LAB — MAGNESIUM: Magnesium: 1.9 mg/dL (ref 1.7–2.4)

## 2022-02-19 LAB — SAMPLE TO BLOOD BANK

## 2022-02-19 MED ORDER — SODIUM CHLORIDE 0.9% FLUSH
10.0000 mL | Freq: Once | INTRAVENOUS | Status: AC
  Administered 2022-02-19: 10 mL

## 2022-02-19 NOTE — Progress Notes (Signed)
Patients hgb is 8.5 and plateletes are 28 today. Patient states he is not symptomatic. Dr. Irene Limbo made aware -- no blood transfusion needed today. Patient aware, deaccessed and ambulatory to the lobby.  ?

## 2022-02-22 ENCOUNTER — Other Ambulatory Visit: Payer: Self-pay | Admitting: *Deleted

## 2022-02-22 ENCOUNTER — Other Ambulatory Visit: Payer: Self-pay

## 2022-02-22 ENCOUNTER — Inpatient Hospital Stay: Payer: 59

## 2022-02-22 DIAGNOSIS — C9201 Acute myeloblastic leukemia, in remission: Secondary | ICD-10-CM | POA: Diagnosis not present

## 2022-02-22 LAB — CBC WITH DIFFERENTIAL (CANCER CENTER ONLY)
Abs Immature Granulocytes: 0.03 10*3/uL (ref 0.00–0.07)
Basophils Absolute: 0 10*3/uL (ref 0.0–0.1)
Basophils Relative: 0 %
Eosinophils Absolute: 0 10*3/uL (ref 0.0–0.5)
Eosinophils Relative: 0 %
HCT: 23.3 % — ABNORMAL LOW (ref 39.0–52.0)
Hemoglobin: 8.2 g/dL — ABNORMAL LOW (ref 13.0–17.0)
Immature Granulocytes: 1 %
Lymphocytes Relative: 4 %
Lymphs Abs: 0.3 10*3/uL — ABNORMAL LOW (ref 0.7–4.0)
MCH: 33.9 pg (ref 26.0–34.0)
MCHC: 35.2 g/dL (ref 30.0–36.0)
MCV: 96.3 fL (ref 80.0–100.0)
Monocytes Absolute: 0.9 10*3/uL (ref 0.1–1.0)
Monocytes Relative: 15 %
Neutro Abs: 4.9 10*3/uL (ref 1.7–7.7)
Neutrophils Relative %: 80 %
Platelet Count: 37 10*3/uL — ABNORMAL LOW (ref 150–400)
RBC: 2.42 MIL/uL — ABNORMAL LOW (ref 4.22–5.81)
RDW: 17.6 % — ABNORMAL HIGH (ref 11.5–15.5)
WBC Count: 6 10*3/uL (ref 4.0–10.5)
nRBC: 0 % (ref 0.0–0.2)

## 2022-02-22 LAB — CMP (CANCER CENTER ONLY)
ALT: 40 U/L (ref 0–44)
AST: 17 U/L (ref 15–41)
Albumin: 4 g/dL (ref 3.5–5.0)
Alkaline Phosphatase: 61 U/L (ref 38–126)
Anion gap: 7 (ref 5–15)
BUN: 14 mg/dL (ref 8–23)
CO2: 26 mmol/L (ref 22–32)
Calcium: 9.2 mg/dL (ref 8.9–10.3)
Chloride: 108 mmol/L (ref 98–111)
Creatinine: 0.7 mg/dL (ref 0.61–1.24)
GFR, Estimated: >60 mL/min (ref >60)
Glucose, Bld: 132 mg/dL — ABNORMAL HIGH (ref 70–99)
Potassium: 3.6 mmol/L (ref 3.5–5.1)
Sodium: 141 mmol/L (ref 135–145)
Total Bilirubin: 0.4 mg/dL (ref 0.3–1.2)
Total Protein: 6.6 g/dL (ref 6.5–8.1)

## 2022-02-22 LAB — MAGNESIUM: Magnesium: 1.7 mg/dL (ref 1.7–2.4)

## 2022-02-22 LAB — SAMPLE TO BLOOD BANK

## 2022-02-22 LAB — PREPARE RBC (CROSSMATCH)

## 2022-02-22 MED ORDER — SODIUM CHLORIDE 0.9% FLUSH
10.0000 mL | INTRAVENOUS | Status: AC | PRN
  Administered 2022-02-22: 10 mL

## 2022-02-22 MED ORDER — ACETAMINOPHEN 325 MG PO TABS
650.0000 mg | ORAL_TABLET | Freq: Once | ORAL | Status: AC
  Administered 2022-02-22: 650 mg via ORAL
  Filled 2022-02-22: qty 2

## 2022-02-22 MED ORDER — SODIUM CHLORIDE 0.9% FLUSH
10.0000 mL | Freq: Once | INTRAVENOUS | Status: AC
  Administered 2022-02-22: 10 mL

## 2022-02-22 MED ORDER — SODIUM CHLORIDE 0.9% IV SOLUTION
250.0000 mL | Freq: Once | INTRAVENOUS | Status: AC
  Administered 2022-02-22: 250 mL via INTRAVENOUS

## 2022-02-22 MED ORDER — HEPARIN SOD (PORK) LOCK FLUSH 100 UNIT/ML IV SOLN
500.0000 [IU] | Freq: Every day | INTRAVENOUS | Status: AC | PRN
  Administered 2022-02-22: 500 [IU]

## 2022-02-22 NOTE — Patient Instructions (Signed)
Blood Transfusion, Adult, Care After This sheet gives you information about how to care for yourself after your procedure. Your doctor may also give you more specific instructions. If you have problems or questions, contact your doctor. What can I expect after the procedure? After the procedure, it is common to have: Bruising and soreness at the IV site. A headache. Follow these instructions at home: Insertion site care   Follow instructions from your doctor about how to take care of your insertion site. This is where an IV tube was put into your vein. Make sure you: Wash your hands with soap and water before and after you change your bandage (dressing). If you cannot use soap and water, use hand sanitizer. Change your bandage as told by your doctor. Check your insertion site every day for signs of infection. Check for: Redness, swelling, or pain. Bleeding from the site. Warmth. Pus or a bad smell. General instructions Take over-the-counter and prescription medicines only as told by your doctor. Rest as told by your doctor. Go back to your normal activities as told by your doctor. Keep all follow-up visits as told by your doctor. This is important. Contact a doctor if: You have itching or red, swollen areas of skin (hives). You feel worried or nervous (anxious). You feel weak after doing your normal activities. You have redness, swelling, warmth, or pain around the insertion site. You have blood coming from the insertion site, and the blood does not stop with pressure. You have pus or a bad smell coming from the insertion site. Get help right away if: You have signs of a serious reaction. This may be coming from an allergy or the body's defense system (immune system). Signs include: Trouble breathing or shortness of breath. Swelling of the face or feeling warm (flushed). Fever or chills. Head, chest, or back pain. Dark pee (urine) or blood in the pee. Widespread rash. Fast  heartbeat. Feeling dizzy or light-headed. You may receive your blood transfusion in an outpatient setting. If so, you will be told whom to contact to report any reactions. These symptoms may be an emergency. Do not wait to see if the symptoms will go away. Get medical help right away. Call your local emergency services (911 in the [place] Do not drive yourself to the hospital. Summary Bruising and soreness at the IV site are common. Check your insertion site every day for signs of infection. Rest as told by your doctor. Go back to your normal activities as told by your doctor. Get help right away if you have signs of a serious reaction. This information is not intended to replace advice given to you by your health care provider. Make sure you discuss any questions you have with your health care provider. Document Revised: 03/23/2021 Document Reviewed: 05/21/2019 Elsevier Patient Education  2022 Elsevier Inc.  

## 2022-02-23 LAB — TYPE AND SCREEN
ABO/RH(D): B NEG
Antibody Screen: NEGATIVE
Unit division: 0

## 2022-02-23 LAB — BPAM RBC
Blood Product Expiration Date: 202303212359
ISSUE DATE / TIME: 202303161028
Unit Type and Rh: 1700

## 2022-02-26 ENCOUNTER — Inpatient Hospital Stay: Payer: 59

## 2022-03-01 ENCOUNTER — Inpatient Hospital Stay: Payer: 59

## 2022-03-01 ENCOUNTER — Other Ambulatory Visit: Payer: Self-pay

## 2022-03-01 VITALS — BP 126/84 | HR 94 | Temp 98.2°F | Resp 18

## 2022-03-01 DIAGNOSIS — D61818 Other pancytopenia: Secondary | ICD-10-CM

## 2022-03-01 DIAGNOSIS — Z95828 Presence of other vascular implants and grafts: Secondary | ICD-10-CM

## 2022-03-01 DIAGNOSIS — C9201 Acute myeloblastic leukemia, in remission: Secondary | ICD-10-CM

## 2022-03-01 LAB — SAMPLE TO BLOOD BANK

## 2022-03-01 LAB — CBC WITH DIFFERENTIAL (CANCER CENTER ONLY)
Abs Immature Granulocytes: 0.03 10*3/uL (ref 0.00–0.07)
Basophils Absolute: 0 10*3/uL (ref 0.0–0.1)
Basophils Relative: 0 %
Eosinophils Absolute: 0 10*3/uL (ref 0.0–0.5)
Eosinophils Relative: 0 %
HCT: 28.6 % — ABNORMAL LOW (ref 39.0–52.0)
Hemoglobin: 9.7 g/dL — ABNORMAL LOW (ref 13.0–17.0)
Immature Granulocytes: 1 %
Lymphocytes Relative: 6 %
Lymphs Abs: 0.4 10*3/uL — ABNORMAL LOW (ref 0.7–4.0)
MCH: 33 pg (ref 26.0–34.0)
MCHC: 33.9 g/dL (ref 30.0–36.0)
MCV: 97.3 fL (ref 80.0–100.0)
Monocytes Absolute: 0.6 10*3/uL (ref 0.1–1.0)
Monocytes Relative: 10 %
Neutro Abs: 5.4 10*3/uL (ref 1.7–7.7)
Neutrophils Relative %: 83 %
Platelet Count: 74 10*3/uL — ABNORMAL LOW (ref 150–400)
RBC: 2.94 MIL/uL — ABNORMAL LOW (ref 4.22–5.81)
RDW: 19.3 % — ABNORMAL HIGH (ref 11.5–15.5)
WBC Count: 6.5 10*3/uL (ref 4.0–10.5)
nRBC: 0 % (ref 0.0–0.2)

## 2022-03-01 LAB — CMP (CANCER CENTER ONLY)
ALT: 24 U/L (ref 0–44)
AST: 13 U/L — ABNORMAL LOW (ref 15–41)
Albumin: 4.1 g/dL (ref 3.5–5.0)
Alkaline Phosphatase: 62 U/L (ref 38–126)
Anion gap: 7 (ref 5–15)
BUN: 14 mg/dL (ref 8–23)
CO2: 27 mmol/L (ref 22–32)
Calcium: 9.4 mg/dL (ref 8.9–10.3)
Chloride: 107 mmol/L (ref 98–111)
Creatinine: 0.73 mg/dL (ref 0.61–1.24)
GFR, Estimated: >60 mL/min (ref >60)
Glucose, Bld: 130 mg/dL — ABNORMAL HIGH (ref 70–99)
Potassium: 3.6 mmol/L (ref 3.5–5.1)
Sodium: 141 mmol/L (ref 135–145)
Total Bilirubin: 0.4 mg/dL (ref 0.3–1.2)
Total Protein: 6.6 g/dL (ref 6.5–8.1)

## 2022-03-01 LAB — MAGNESIUM: Magnesium: 1.8 mg/dL (ref 1.7–2.4)

## 2022-03-01 MED ORDER — SODIUM CHLORIDE 0.9% FLUSH
10.0000 mL | Freq: Once | INTRAVENOUS | Status: AC
  Administered 2022-03-01: 10 mL

## 2022-03-01 MED ORDER — HEPARIN SOD (PORK) LOCK FLUSH 100 UNIT/ML IV SOLN
500.0000 [IU] | Freq: Once | INTRAVENOUS | Status: AC
  Administered 2022-03-01: 500 [IU] via INTRAVENOUS

## 2022-03-01 NOTE — Progress Notes (Signed)
Per [facility] parameters, patient does not need transfusion today with Hgb 9.7g/dL and PLT 74K/uL.  Patient aware and states he is not symptomatic.  Port deaccessed, vital signs stable.  Pt ambulatory to home. ?

## 2022-03-05 ENCOUNTER — Other Ambulatory Visit: Payer: 59

## 2022-03-05 ENCOUNTER — Inpatient Hospital Stay: Payer: 59

## 2022-03-08 ENCOUNTER — Other Ambulatory Visit: Payer: 59

## 2022-09-17 IMAGING — CT CT BIOPSY AND ASPIRATION BONE MARROW
1 of 2 series · 10 of 14 positions shown, 13 images · non-contrast
Comparison: none

CLINICAL DATA: Pancytopenia and need for bone marrow biopsy.

[Series 2: i-spiral 5.0 b30f · axial · 0.94mm/px · z∈[+829,+882]mm · 10 of 19 slices shown, 13 images]
[im 2/19  soft-tissue]
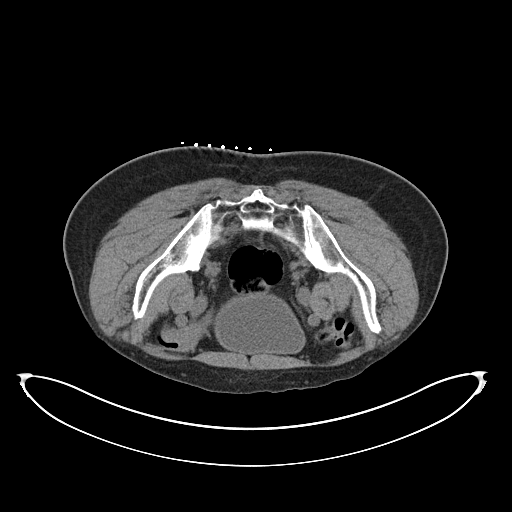
[im 2/19  bone]
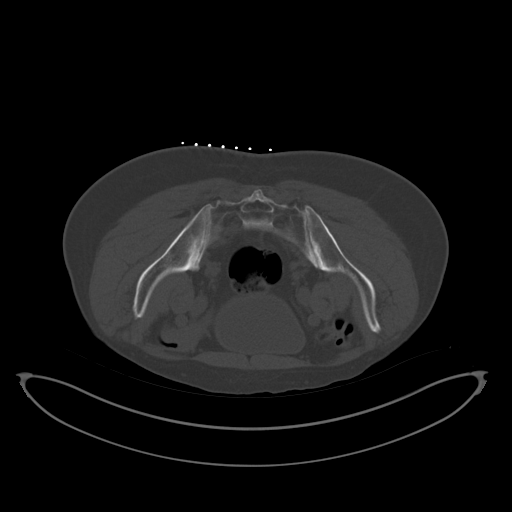
[im 4/19  bone]
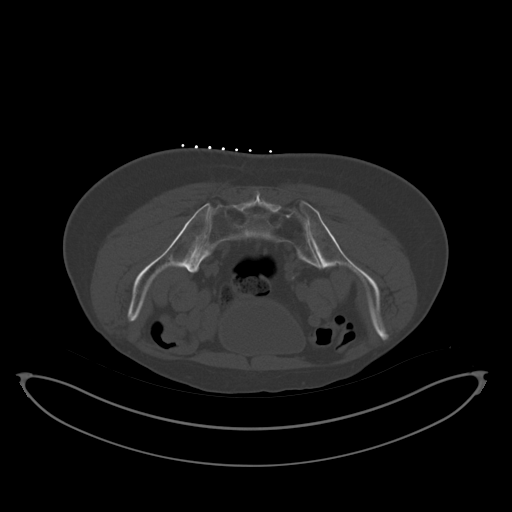
[im 5/19  bone]
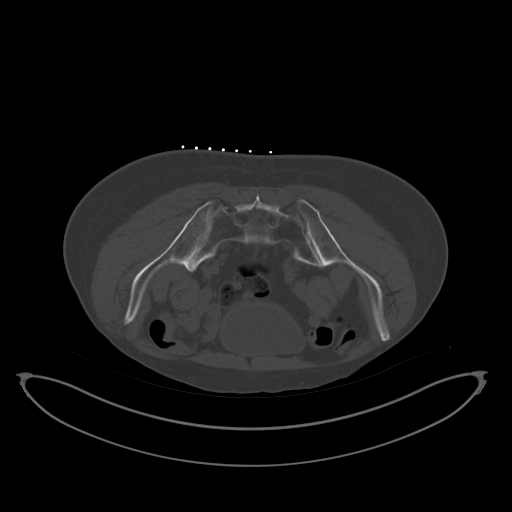
[im 7/19  bone]
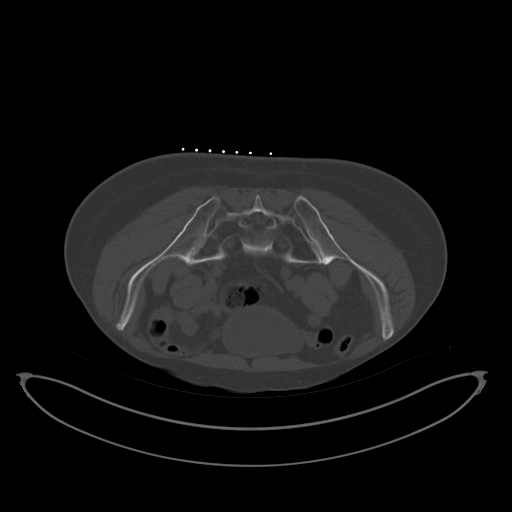
[im 9/19  soft-tissue]
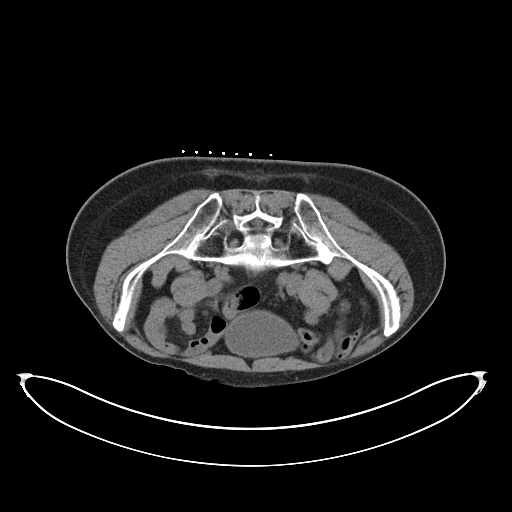
[im 9/19  bone]
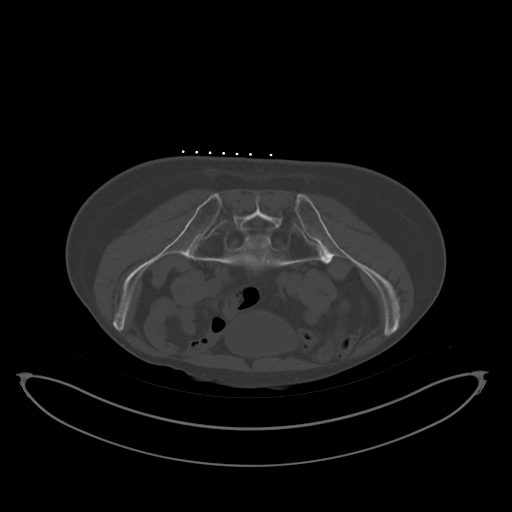
[im 10/19  bone]
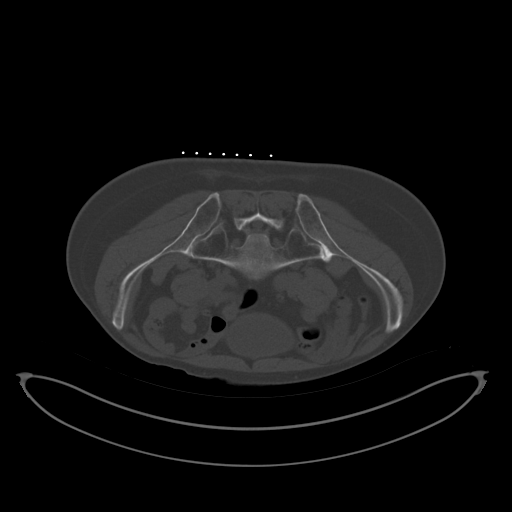
[im 12/19  bone]
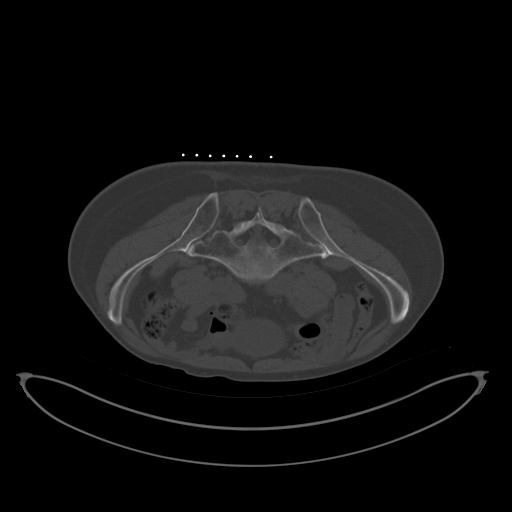
[im 14/19  bone]
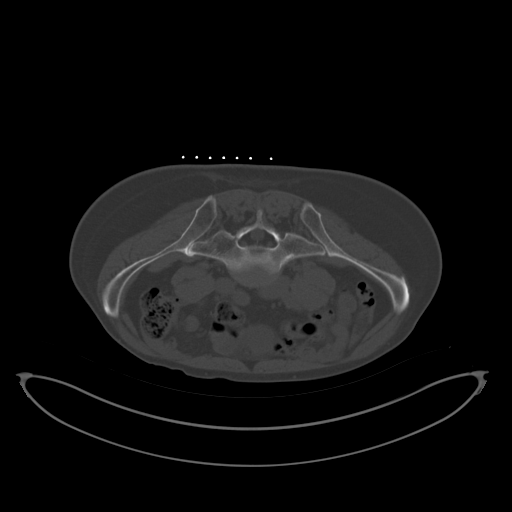
[im 15/19  soft-tissue]
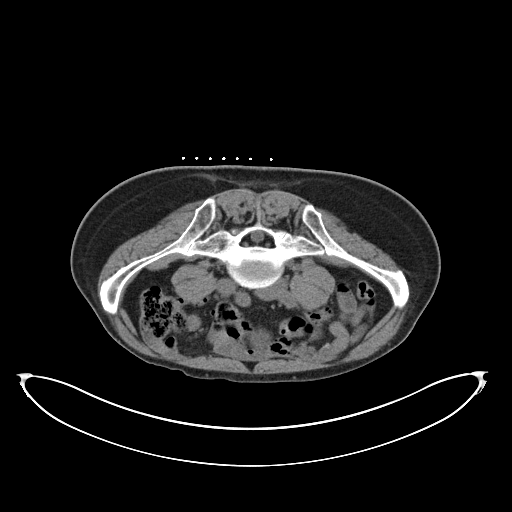
[im 15/19  bone]
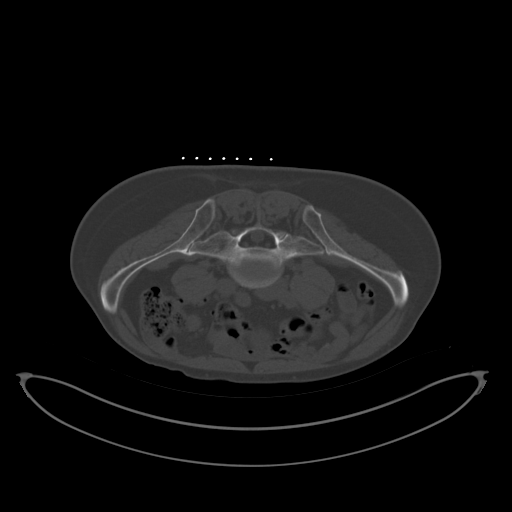
[im 17/19  bone]
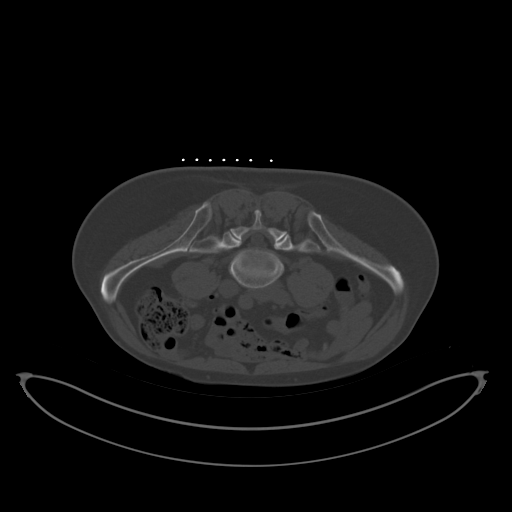

[10 of 14 positions shown; findings below may reference images not displayed]

EXAM:
CT GUIDED BONE MARROW ASPIRATION AND BIOPSY

ANESTHESIA/SEDATION:
Moderate (conscious) sedation was employed during this procedure. A
total of Versed 1.0 mg and Fentanyl 50 mcg was administered
intravenously by radiology nursing under my direct supervision.

Moderate Sedation Time: 17 minutes. The patient's level of
consciousness and vital signs were monitored continuously by
radiology nursing throughout the procedure under my direct
supervision.

PROCEDURE:
The procedure risks, benefits, and alternatives were explained to
the patient. Questions regarding the procedure were encouraged and
answered. The patient understands and consents to the procedure. A
time out was performed prior to initiating the procedure.

The right gluteal region was prepped with chlorhexidine. Sterile
gown and sterile gloves were used for the procedure. Local
anesthesia was provided with 1% Lidocaine.

Under CT guidance, an 11 gauge On Control bone cutting needle was
advanced from a posterior approach into the right iliac bone. Needle
positioning was confirmed with CT. Initial non heparinized and
heparinized aspirate samples were obtained of bone marrow. Core
biopsy was performed via the On Control drill needle.

COMPLICATIONS:
None
FINDINGS: Inspection of initial aspirate did reveal visible particles. Intact
core biopsy sample was obtained.
IMPRESSION: CT guided bone marrow biopsy of right posterior iliac bone with both
aspirate and core samples obtained.

## 2024-06-09 DIAGNOSIS — K080 Exfoliation of teeth due to systemic causes: Secondary | ICD-10-CM | POA: Diagnosis not present

## 2024-07-16 DIAGNOSIS — L821 Other seborrheic keratosis: Secondary | ICD-10-CM | POA: Diagnosis not present

## 2024-07-16 DIAGNOSIS — D225 Melanocytic nevi of trunk: Secondary | ICD-10-CM | POA: Diagnosis not present

## 2024-07-16 DIAGNOSIS — L508 Other urticaria: Secondary | ICD-10-CM | POA: Diagnosis not present

## 2024-07-16 DIAGNOSIS — L814 Other melanin hyperpigmentation: Secondary | ICD-10-CM | POA: Diagnosis not present

## 2024-10-29 DIAGNOSIS — Z9484 Stem cells transplant status: Secondary | ICD-10-CM | POA: Diagnosis not present

## 2024-10-29 DIAGNOSIS — C9201 Acute myeloblastic leukemia, in remission: Secondary | ICD-10-CM | POA: Diagnosis not present

## 2024-11-10 DIAGNOSIS — M810 Age-related osteoporosis without current pathological fracture: Secondary | ICD-10-CM | POA: Diagnosis not present

## 2024-11-17 DIAGNOSIS — K080 Exfoliation of teeth due to systemic causes: Secondary | ICD-10-CM | POA: Diagnosis not present
